# Patient Record
Sex: Female | Born: 1955
Health system: Southern US, Community
[De-identification: ages and names within clinical notes are randomized; demographics above are authoritative.]

## PROBLEM LIST (undated history)

## (undated) DIAGNOSIS — R911 Solitary pulmonary nodule: Secondary | ICD-10-CM

## (undated) DIAGNOSIS — M1990 Unspecified osteoarthritis, unspecified site: Secondary | ICD-10-CM

## (undated) DIAGNOSIS — K219 Gastro-esophageal reflux disease without esophagitis: Secondary | ICD-10-CM

## (undated) HISTORY — PX: ABDOMINAL HYSTERECTOMY: SHX81

---

## 2005-10-26 ENCOUNTER — Ambulatory Visit: Payer: Self-pay

## 2007-07-23 ENCOUNTER — Ambulatory Visit: Payer: Self-pay | Admitting: Internal Medicine

## 2009-04-07 ENCOUNTER — Ambulatory Visit: Payer: Self-pay | Admitting: Internal Medicine

## 2010-01-03 ENCOUNTER — Ambulatory Visit: Payer: Self-pay | Admitting: Internal Medicine

## 2010-01-25 ENCOUNTER — Ambulatory Visit: Payer: Self-pay | Admitting: Internal Medicine

## 2011-02-01 ENCOUNTER — Ambulatory Visit: Payer: Self-pay

## 2012-02-27 ENCOUNTER — Ambulatory Visit: Payer: Self-pay | Admitting: Internal Medicine

## 2012-07-29 ENCOUNTER — Ambulatory Visit: Payer: Self-pay | Admitting: Internal Medicine

## 2012-07-30 ENCOUNTER — Ambulatory Visit: Payer: Self-pay | Admitting: Internal Medicine

## 2013-04-22 ENCOUNTER — Ambulatory Visit: Payer: Self-pay | Admitting: Internal Medicine

## 2014-08-13 ENCOUNTER — Ambulatory Visit: Payer: Self-pay | Admitting: Physician Assistant

## 2014-08-28 ENCOUNTER — Ambulatory Visit: Payer: Self-pay | Admitting: Physician Assistant

## 2015-08-03 ENCOUNTER — Other Ambulatory Visit: Payer: Self-pay | Admitting: Physician Assistant

## 2015-08-03 DIAGNOSIS — Z1231 Encounter for screening mammogram for malignant neoplasm of breast: Secondary | ICD-10-CM

## 2015-08-17 ENCOUNTER — Ambulatory Visit
Admission: RE | Admit: 2015-08-17 | Discharge: 2015-08-17 | Disposition: A | Discharge status: Home / Self Care | Payer: BLUE CROSS/BLUE SHIELD | Source: Ambulatory Visit | Attending: Physician Assistant | Admitting: Physician Assistant

## 2015-08-17 DIAGNOSIS — Z1231 Encounter for screening mammogram for malignant neoplasm of breast: Secondary | ICD-10-CM | POA: Insufficient documentation

## 2015-08-18 ENCOUNTER — Ambulatory Visit: Payer: BLUE CROSS/BLUE SHIELD

## 2015-08-18 ENCOUNTER — Ambulatory Visit: Payer: Self-pay

## 2016-02-14 DIAGNOSIS — L578 Other skin changes due to chronic exposure to nonionizing radiation: Secondary | ICD-10-CM | POA: Diagnosis not present

## 2016-02-14 DIAGNOSIS — L570 Actinic keratosis: Secondary | ICD-10-CM | POA: Diagnosis not present

## 2016-03-29 DIAGNOSIS — Z1321 Encounter for screening for nutritional disorder: Secondary | ICD-10-CM | POA: Diagnosis not present

## 2016-03-29 DIAGNOSIS — E6609 Other obesity due to excess calories: Secondary | ICD-10-CM | POA: Diagnosis not present

## 2016-03-29 DIAGNOSIS — Z6833 Body mass index (BMI) 33.0-33.9, adult: Secondary | ICD-10-CM | POA: Diagnosis not present

## 2016-03-29 DIAGNOSIS — Z1329 Encounter for screening for other suspected endocrine disorder: Secondary | ICD-10-CM | POA: Diagnosis not present

## 2016-03-29 DIAGNOSIS — R5383 Other fatigue: Secondary | ICD-10-CM | POA: Diagnosis not present

## 2016-05-11 ENCOUNTER — Ambulatory Visit: Payer: BLUE CROSS/BLUE SHIELD | Admitting: Internal Medicine

## 2016-05-24 DIAGNOSIS — Z13220 Encounter for screening for lipoid disorders: Secondary | ICD-10-CM | POA: Diagnosis not present

## 2016-05-24 DIAGNOSIS — Z01419 Encounter for gynecological examination (general) (routine) without abnormal findings: Secondary | ICD-10-CM | POA: Diagnosis not present

## 2016-05-24 DIAGNOSIS — N952 Postmenopausal atrophic vaginitis: Secondary | ICD-10-CM | POA: Diagnosis not present

## 2016-05-24 DIAGNOSIS — Z6831 Body mass index (BMI) 31.0-31.9, adult: Secondary | ICD-10-CM | POA: Diagnosis not present

## 2016-06-12 DIAGNOSIS — Z1211 Encounter for screening for malignant neoplasm of colon: Secondary | ICD-10-CM | POA: Diagnosis not present

## 2016-06-12 DIAGNOSIS — Z01818 Encounter for other preprocedural examination: Secondary | ICD-10-CM | POA: Diagnosis not present

## 2016-07-31 DIAGNOSIS — Z1211 Encounter for screening for malignant neoplasm of colon: Secondary | ICD-10-CM | POA: Diagnosis not present

## 2016-07-31 DIAGNOSIS — K648 Other hemorrhoids: Secondary | ICD-10-CM | POA: Diagnosis not present

## 2016-07-31 DIAGNOSIS — D126 Benign neoplasm of colon, unspecified: Secondary | ICD-10-CM | POA: Diagnosis not present

## 2016-07-31 DIAGNOSIS — K5730 Diverticulosis of large intestine without perforation or abscess without bleeding: Secondary | ICD-10-CM | POA: Diagnosis not present

## 2016-07-31 DIAGNOSIS — D122 Benign neoplasm of ascending colon: Secondary | ICD-10-CM | POA: Diagnosis not present

## 2016-07-31 DIAGNOSIS — K635 Polyp of colon: Secondary | ICD-10-CM | POA: Diagnosis not present

## 2016-08-03 DIAGNOSIS — Z1211 Encounter for screening for malignant neoplasm of colon: Secondary | ICD-10-CM | POA: Diagnosis not present

## 2016-08-03 DIAGNOSIS — D126 Benign neoplasm of colon, unspecified: Secondary | ICD-10-CM | POA: Diagnosis not present

## 2016-08-30 DIAGNOSIS — L570 Actinic keratosis: Secondary | ICD-10-CM | POA: Diagnosis not present

## 2016-08-30 DIAGNOSIS — L821 Other seborrheic keratosis: Secondary | ICD-10-CM | POA: Diagnosis not present

## 2016-08-30 DIAGNOSIS — L812 Freckles: Secondary | ICD-10-CM | POA: Diagnosis not present

## 2016-08-30 DIAGNOSIS — L578 Other skin changes due to chronic exposure to nonionizing radiation: Secondary | ICD-10-CM | POA: Diagnosis not present

## 2016-10-13 ENCOUNTER — Emergency Department: Payer: BLUE CROSS/BLUE SHIELD

## 2016-10-13 ENCOUNTER — Emergency Department
Admission: EM | Admit: 2016-10-13 | Discharge: 2016-10-13 | Disposition: A | Discharge status: Home / Self Care | Payer: BLUE CROSS/BLUE SHIELD | Attending: Emergency Medicine | Admitting: Emergency Medicine

## 2016-10-13 ENCOUNTER — Encounter: Payer: Self-pay | Admitting: Emergency Medicine

## 2016-10-13 DIAGNOSIS — K8051 Calculus of bile duct without cholangitis or cholecystitis with obstruction: Secondary | ICD-10-CM | POA: Insufficient documentation

## 2016-10-13 DIAGNOSIS — R1011 Right upper quadrant pain: Secondary | ICD-10-CM | POA: Diagnosis not present

## 2016-10-13 DIAGNOSIS — K8050 Calculus of bile duct without cholangitis or cholecystitis without obstruction: Secondary | ICD-10-CM

## 2016-10-13 LAB — URINALYSIS, COMPLETE (UACMP) WITH MICROSCOPIC
Bacteria, UA: NONE SEEN
Bilirubin Urine: NEGATIVE
Glucose, UA: NEGATIVE mg/dL
Hgb urine dipstick: NEGATIVE
KETONES UR: NEGATIVE mg/dL
Leukocytes, UA: NEGATIVE
Nitrite: NEGATIVE
PH: 6 (ref 5.0–8.0)
Protein, ur: NEGATIVE mg/dL
SPECIFIC GRAVITY, URINE: 1.009 (ref 1.005–1.030)

## 2016-10-13 LAB — COMPREHENSIVE METABOLIC PANEL
ALT: 20 U/L (ref 14–54)
AST: 20 U/L (ref 15–41)
Albumin: 4.4 g/dL (ref 3.5–5.0)
Alkaline Phosphatase: 69 U/L (ref 38–126)
Anion gap: 6 (ref 5–15)
BUN: 11 mg/dL (ref 6–20)
CHLORIDE: 106 mmol/L (ref 101–111)
CO2: 24 mmol/L (ref 22–32)
CREATININE: 0.78 mg/dL (ref 0.44–1.00)
Calcium: 9.3 mg/dL (ref 8.9–10.3)
GFR calc Af Amer: >60 mL/min (ref >60)
GFR calc non Af Amer: >60 mL/min (ref >60)
GLUCOSE: 100 mg/dL — AB (ref 65–99)
Potassium: 4 mmol/L (ref 3.5–5.1)
SODIUM: 136 mmol/L (ref 135–145)
Total Bilirubin: 1.1 mg/dL (ref 0.3–1.2)
Total Protein: 7.4 g/dL (ref 6.5–8.1)

## 2016-10-13 LAB — LIPASE, BLOOD: LIPASE: 33 U/L (ref 11–51)

## 2016-10-13 LAB — CBC
HCT: 41.9 % (ref 35.0–47.0)
Hemoglobin: 14.5 g/dL (ref 12.0–16.0)
MCH: 31.2 pg (ref 26.0–34.0)
MCHC: 34.7 g/dL (ref 32.0–36.0)
MCV: 90.1 fL (ref 80.0–100.0)
PLATELETS: 227 10*3/uL (ref 150–440)
RBC: 4.66 MIL/uL (ref 3.80–5.20)
RDW: 12.5 % (ref 11.5–14.5)
WBC: 6.7 10*3/uL (ref 3.6–11.0)

## 2016-10-13 MED ORDER — OXYCODONE-ACETAMINOPHEN 5-325 MG PO TABS: 1.0000 | ORAL_TABLET | Freq: Four times a day (QID) | ORAL | 0 refills | Status: DC | PRN

## 2016-10-13 MED ORDER — IOPAMIDOL (ISOVUE-300) INJECTION 61%
30.0000 mL | Freq: Once | INTRAVENOUS | Status: AC | PRN
  Administered 2016-10-13: 30 mL via ORAL

## 2016-10-13 MED ORDER — IOPAMIDOL (ISOVUE-300) INJECTION 61%
100.0000 mL | Freq: Once | INTRAVENOUS | Status: AC | PRN
  Administered 2016-10-13: 100 mL via INTRAVENOUS

## 2016-10-13 NOTE — ED Notes (Signed)
Pt called out stating she was done with contrast, CT notified.  

## 2016-10-13 NOTE — ED Triage Notes (Signed)
RUQ pain for 2 days. Had had some nausea/vomiting intermittently over past 2 months. Denies known fevers or diarrhea. Reports black stool over past 2 days.

## 2016-10-13 NOTE — ED Notes (Signed)
Patient c/o RUQ abd pain and epigastric abd pain. Patient states that this is an on going issue but has been worse the past 2 days. Patient report intermittent nausea and vomiting for the past 2 months. Patient states that she does have hx/o gallstones.  Patient denies bright red blood in her stool but states that for the past 2 days her stool has been black. Patient has been taking Pepto-bismol.   Patient in NAD at this time. Breathing is equal and unlabored.

## 2016-10-13 NOTE — ED Provider Notes (Signed)
[facility] Emergency Department Provider Note   ____________________________________________   First MD Initiated Contact with Patient 10/13/16 1343     (approximate)  I have reviewed the triage vital signs and the nursing notes.   HISTORY  Chief Complaint Abdominal Pain    HPI Erin Weiss is a 60 y.o. female who reports a past history of gallstones. He also reports about 2-1/2 months of intermittent vomiting. None for the last 2 days she's had episodes of right upper quadrant or epigastric pain. This is especially worse at night for the last 2 nights. It was unresponsive to Pepcid Zantac and Pepto-Bismol. Pain was severe. It was not associated with fatty foods or meals at all.   History reviewed. No pertinent past medical history.  There are no active problems to display for this patient.   Past Surgical History:  Procedure Laterality Date  . ABDOMINAL HYSTERECTOMY    . CESAREAN SECTION      Prior to Admission medications   Medication Sig Start Date End Date Taking? Authorizing Provider  oxyCODONE-acetaminophen (ROXICET) 5-325 MG tablet Take 1 tablet by mouth every 6 (six) hours as needed. 10/13/16 10/13/17  Arnaldo Natal, MD    Allergies Penicillins  History reviewed. No pertinent family history.  Social History Social History  Substance Use Topics  . Smoking status: Never Smoker  . Smokeless tobacco: Never Used  . Alcohol use Yes    Review of Systems Constitutional: No fever/chills Eyes: No visual changes. ENT: No sore throat. Cardiovascular: Denies chest pain. Respiratory: Denies shortness of breath. Gastrointestinal: See history of present illness Genitourinary: Negative for dysuria. Musculoskeletal: Negative for back pain. Skin: Negative for rash. Neurological: Negative for headaches, focal weakness or numbness.  10-point ROS otherwise negative.  ____________________________________________   PHYSICAL  EXAM:  VITAL SIGNS: ED Triage Vitals [10/13/16 1028]  Enc Vitals Group     BP 131/88     Pulse Rate 89     Resp 18     Temp 98.1 F (36.7 C)     Temp Source Oral     SpO2 99 %     Weight 180 lb (81.6 kg)     Height 5\' 5"  (1.651 m)     Head Circumference      Peak Flow      Pain Score 4     Pain Loc      Pain Edu?      Excl. in GC?     Constitutional: Alert and oriented. Well appearing and in no acute distress. Eyes: Conjunctivae are normal. PERRL. EOMI. Head: Atraumatic. Nose: No congestion/rhinnorhea. Mouth/Throat: Mucous membranes are moist.  Oropharynx non-erythematous. Neck: No stridor. Cardiovascular: Normal rate, regular rhythm. Grossly normal heart sounds.  Good peripheral circulation. Respiratory: Normal respiratory effort.  No retractions. Lungs CTAB. Gastrointestinal: Soft and nontenderExcept for to deep palpation in the epigastric area and right upper quadrant. No distention. No abdominal bruits. No CVA tenderness. No tenderness to percussion Musculoskeletal: No lower extremity tenderness nor edema.  No joint effusions. Neurologic:  Normal speech and language. No gross focal neurologic deficits are appreciated. No gait instability. Skin:  Skin is warm, dry and intact. No rash noted.  ____________________________________________   LABS (all labs ordered are listed, but only abnormal results are displayed)  Labs Reviewed  COMPREHENSIVE METABOLIC PANEL - Abnormal; Notable for the following:       Result Value   Glucose, Bld 100 (*)    All other components within normal  limits  URINALYSIS, COMPLETE (UACMP) WITH MICROSCOPIC - Abnormal; Notable for the following:    Color, Urine YELLOW (*)    APPearance CLEAR (*)    Squamous Epithelial / LPF 0-5 (*)    All other components within normal limits  LIPASE, BLOOD  CBC   ____________________________________________  EKG  EKG read and interpreted by me shows normal sinus rhythm rate of 73 normal axis no acute  ST-T wave changes are or 2 PVCs present ____________________________________________  RADIOLOGY Addendum   ADDENDUM REPORT: 10/13/2016 15:58  ADDENDUM: 5 mm subpleural right lower lobe pulmonary nodule. No follow-up needed if patient is low-risk. Non-contrast chest CT can be considered in 12 months if patient is high-risk. This recommendation follows the consensus statement: Guidelines for Management of Incidental Pulmonary Nodules Detected on CT Images: From the Fleischner Society 2017; Radiology 2017; 284:228-243.   Electronically Signed   By: Jasmine PangKim  Fujinaga M.D.   On: 10/13/2016 15:58   Addended by Adrian ProwsKim M Fujinaga, MD on 10/13/2016 4:01 PM    Study Result   CLINICAL DATA:  Right upper quadrant abdominal pain and epigastric pain  EXAM: CT ABDOMEN AND PELVIS WITH CONTRAST  TECHNIQUE: Multidetector CT imaging of the abdomen and pelvis was performed using the standard protocol following bolus administration of intravenous contrast.  CONTRAST:  100mL ISOVUE-300 IOPAMIDOL (ISOVUE-300) INJECTION 61%  COMPARISON:  Ultrasound 07/30/2012  FINDINGS: Lower chest: No acute consolidation or pleural effusion. In the subpleural right lower lobe there is a 5 x 4 mm nodule (5 mm mean diameter) on image 13 of series 4. No other lung nodules are visualized. Heart size nonenlarged.  Hepatobiliary: There is fatty infiltration of the liver. No focal hepatic abnormality. Multiple large calcified gallstones. No wall thickening. No biliary dilatation.  Pancreas: Unremarkable. No pancreatic ductal dilatation or surrounding inflammatory changes.  Spleen: Normal in size without focal abnormality.  Adrenals/Urinary Tract: Adrenal glands are unremarkable. Kidneys are normal, without renal calculi, focal lesion, or hydronephrosis. Bladder is unremarkable.  Stomach/Bowel: Stomach is within normal limits. Appendix appears normal. No evidence of bowel wall thickening,  distention, or inflammatory changes. Diverticular disease of the sigmoid colon without wall thickening or acute inflammation.  Vascular/Lymphatic: Aortic atherosclerosis. No enlarged abdominal or pelvic lymph nodes.  Reproductive: Status post hysterectomy. No adnexal masses.  Other: No abdominal wall hernia or abnormality. No abdominopelvic ascites.  Musculoskeletal: No acute or significant osseous findings.  IMPRESSION: 1. No CT evidence for acute intra- abdominal or pelvic pathology 2. Large calcified gallstones without wall thickening or biliary dilatation 3. Fatty infiltration of the liver 4. Sigmoid colon diverticular disease without acute inflammation  Electronically Signed: By: Jasmine PangKim  Fujinaga M.D. On: 10/13/2016 15:49        ____________________________________________   PROCEDURES  Procedure(s) performed:   Procedures  Critical Care performed:   ____________________________________________   INITIAL IMPRESSION / ASSESSMENT AND PLAN / ED COURSE  Pertinent labs & imaging results that were available during my care of the patient were reviewed by me and considered in my medical decision making (see chart for details).    Clinical Course   Discussed patient with surgery. They feel since her labs are normal her CT shows no inflammation and she is not currently having any large amount of pain that she can follow-up and as an outpatient in the office. I will relay this information to her.  ____________________________________________   FINAL CLINICAL IMPRESSION(S) / ED DIAGNOSES  Final diagnoses:  Biliary colic      NEW MEDICATIONS  STARTED DURING THIS VISIT:  New Prescriptions   OXYCODONE-ACETAMINOPHEN (ROXICET) 5-325 MG TABLET    Take 1 tablet by mouth every 6 (six) hours as needed.     Note:  This document was prepared using Dragon voice recognition software and may include unintentional dictation errors.    Arnaldo NatalPaul F Analya Louissaint,  MD 10/13/16 [phone]

## 2016-10-13 NOTE — Discharge Instructions (Signed)
Please eat a low-fat diet. [facility] Monday morning they should be on to see you in the office and schedule a cholecystectomy , in other words take your gallbladder out. Please return for worse pain fever or vomiting. You can use the Percocet 1 or 2 pills 4 times a day if needed for the pain. Be careful and can make you woozy do not fall, do not drive on it. Also Percocet can make you constipated.

## 2016-10-18 ENCOUNTER — Other Ambulatory Visit: Payer: Self-pay

## 2016-10-26 ENCOUNTER — Encounter: Payer: Self-pay | Admitting: Surgery

## 2016-10-26 ENCOUNTER — Telehealth: Payer: Self-pay | Admitting: Surgery

## 2016-10-26 ENCOUNTER — Ambulatory Visit (INDEPENDENT_AMBULATORY_CARE_PROVIDER_SITE_OTHER): Payer: BLUE CROSS/BLUE SHIELD | Admitting: Surgery

## 2016-10-26 VITALS — BP 125/79 | HR 80 | Temp 98.4°F | Ht 65.0 in | Wt 182.8 lb

## 2016-10-26 DIAGNOSIS — K8050 Calculus of bile duct without cholangitis or cholecystitis without obstruction: Secondary | ICD-10-CM

## 2016-10-26 NOTE — Telephone Encounter (Signed)
Pt advised of pre op date/time and sx date. Sx: 11/03/16 with Dr Ludwig Clarksooper--Laparoscopic cholecystectomy.  Pre op: 11/01/16 between 1-5:00phone.   Patient made aware to call [phone], between 1-3:00pm the day before surgery, to find out what time to arrive.

## 2016-10-26 NOTE — Progress Notes (Signed)
  Surgical Consultation  10/26/2016  Erin Weiss is an 60 y.o. female.   ZO:XWRUEAVCC:biliary colic  HPI: This a patient who is in the emergency room with biliary colic. Patient has had recurrent and episodic right upper quadrant pain associated fatty food intolerance and workup showing gallstones. She denies jaundice or acholic stools. Has had no fevers or chills.  No past medical history on file.  Past Surgical History:  Procedure Laterality Date  . ABDOMINAL HYSTERECTOMY    . CESAREAN SECTION      No family history on file. No family history of gallbladder disease  Social History:  reports that she has never smoked. She has never used smokeless tobacco. She reports that she drinks alcohol. She reports that she does not use drugs.  Allergies:  Allergies  Allergen Reactions  . Penicillins Swelling    Medications reviewed.   Review of Systems:   Review of Systems  Constitutional: Negative.   HENT: Negative.   Eyes: Negative.   Respiratory: Negative.   Cardiovascular: Negative.   Gastrointestinal: Positive for abdominal pain and nausea. Negative for blood in stool, constipation, diarrhea, heartburn, melena and vomiting.  Genitourinary: Negative.   Musculoskeletal: Negative.   Skin: Negative.   Neurological: Negative.   Endo/Heme/Allergies: Negative.   Psychiatric/Behavioral: Negative.      Physical Exam:  There were no vitals taken for this visit.  Physical Exam  Constitutional: She is oriented to person, place, and time and well-developed, well-nourished, and in no distress. No distress.  HENT:  Head: Normocephalic and atraumatic.  Eyes: Pupils are equal, round, and reactive to light. Right eye exhibits no discharge. Left eye exhibits no discharge. No scleral icterus.  Neck: Normal range of motion.  Cardiovascular: Normal rate, regular rhythm and normal heart sounds.   Pulmonary/Chest: Effort normal. No respiratory distress. She has no wheezes. She has no rales.   Abdominal: Soft. She exhibits no distension. There is no tenderness. There is no rebound and no guarding.  Musculoskeletal: Normal range of motion. She exhibits no edema or tenderness.  Lymphadenopathy:    She has no cervical adenopathy.  Neurological: She is alert and oriented to person, place, and time.  Skin: Skin is warm and dry. She is not diaphoretic. No erythema.  Psychiatric: Mood and affect normal.  Vitals reviewed.     No results found for this or any previous visit (from the past 48 hour(s)). No results found.  Assessment/Plan:  Biliary colic. She requires laparoscopic cholecystectomy for control of her frequent symptoms. The rationale for this been discussed the options of observation reviewed and the risk of bleeding infection recurrence of symptoms failure to resolve her symptoms conversion to an open procedure bile duct damage bile duct leak retained common bile duct stone any of which could require further surgery and/or ERCP stent papillotomy were all reviewed with her she understood and agreed to proceed.  Lattie Hawichard E Cooper, MD, FACS

## 2016-10-26 NOTE — Patient Instructions (Signed)
We have scheduled your surgery on 11-03-16 at [facility] with Dr.Cooper. Please see your blue pre-care sheet for surgery information. Please call our office if you have any questions or concerns.

## 2016-11-01 ENCOUNTER — Encounter
Admission: RE | Admit: 2016-11-01 | Discharge: 2016-11-01 | Disposition: A | Discharge status: Home / Self Care | Payer: BLUE CROSS/BLUE SHIELD | Source: Ambulatory Visit | Attending: Surgery | Admitting: Surgery

## 2016-11-01 HISTORY — DX: Unspecified osteoarthritis, unspecified site: M19.90

## 2016-11-01 HISTORY — DX: Gastro-esophageal reflux disease without esophagitis: K21.9

## 2016-11-01 HISTORY — DX: Solitary pulmonary nodule: R91.1

## 2016-11-01 NOTE — Patient Instructions (Addendum)
  Your procedure is scheduled on: 11-03-16 (FRIDAY) Report to Same Day Surgery 2nd floor medical mall [facility](Medical Mall Entrance-take elevator on left to 2nd floor.  Check in with surgery information desk.) To find out your arrival time please call [phone] between 1PM - 3PM on 11-02-16 (THURSDAY)  Remember: Instructions that are not followed completely may result in serious medical risk, up to and including death, or upon the discretion of your surgeon and anesthesiologist your surgery may need to be rescheduled.    _x___ 1. Do not eat food or drink liquids after midnight. No gum chewing or hard candies.     __x__ 2. No Alcohol for 24 hours before or after surgery.   __x__3. No Smoking for 24 prior to surgery.   ____  4. Bring all medications with you on the day of surgery if instructed.    __x__ 5. Notify your doctor if there is any change in your medical condition     (cold, fever, infections).     Do not wear jewelry, make-up, hairpins, clips or nail polish.  Do not wear lotions, powders, or perfumes. You may wear deodorant.  Do not shave 48 hours prior to surgery. Men may shave face and neck.  Do not bring valuables to the hospital.    [facility] any belongings or valuables.               Contacts, dentures or bridgework may not be worn into surgery.  Leave your suitcase in the car. After surgery it may be brought to your room.  For patients admitted to the hospital, discharge time is determined by your treatment team.   Patients discharged the day of surgery will not be allowed to drive home.  You will need someone to drive you home and stay with you the night of your procedure.    Please read over the following fact sheets that you were given:   [facility] Preparing [facility] and or MRSA Information   _X___ Take these medicines the morning of surgery with A SIP OF WATER:    1. PRILOSEC  2. ALSO TAKE A PRILOSEC BEFORE BED THE NIGHT BEFORE SURGERY  (THURSDAY NIGHT)  3.  4.  5.  6.  ____Fleets enema or Magnesium Citrate as directed.   _x___ Use CHG Soap or sage wipes as directed on instruction sheet   ____ Use inhalers on the day of surgery and bring to hospital day of surgery  ____ Stop metformin 2 days prior to surgery    ____ Take 1/2 of usual insulin dose the night before surgery and none on the morning of  surgery.   ____ Stop Aspirin, Coumadin, Pllavix ,Eliquis, Effient, or Pradaxa  x__ Stop Anti-inflammatories such as Advil, Aleve, Ibuprofen, Motrin, Naproxen,          Naprosyn, Goodies powders or aspirin products. Ok to take Tylenol OR OXYCODONE IF NEEDED   ____ Stop supplements until after surgery.    ____ Bring C-Pap to the hospital.

## 2016-11-02 ENCOUNTER — Encounter
Admission: RE | Admit: 2016-11-02 | Discharge: 2016-11-02 | Disposition: A | Discharge status: Home / Self Care | Payer: BLUE CROSS/BLUE SHIELD | Source: Ambulatory Visit | Attending: Surgery | Admitting: Surgery

## 2016-11-02 DIAGNOSIS — K8010 Calculus of gallbladder with chronic cholecystitis without obstruction: Secondary | ICD-10-CM | POA: Diagnosis not present

## 2016-11-02 DIAGNOSIS — Z880 Allergy status to penicillin: Secondary | ICD-10-CM | POA: Diagnosis not present

## 2016-11-02 DIAGNOSIS — E669 Obesity, unspecified: Secondary | ICD-10-CM | POA: Diagnosis not present

## 2016-11-02 DIAGNOSIS — K8064 Calculus of gallbladder and bile duct with chronic cholecystitis without obstruction: Secondary | ICD-10-CM | POA: Diagnosis present

## 2016-11-02 DIAGNOSIS — Z90710 Acquired absence of both cervix and uterus: Secondary | ICD-10-CM | POA: Diagnosis not present

## 2016-11-02 LAB — CBC WITH DIFFERENTIAL/PLATELET
BASOS PCT: 1 %
Basophils Absolute: 0.1 10*3/uL (ref 0–0.1)
EOS ABS: 0.1 10*3/uL (ref 0–0.7)
EOS PCT: 2 %
HCT: 40.2 % (ref 35.0–47.0)
HEMOGLOBIN: 13.9 g/dL (ref 12.0–16.0)
LYMPHS ABS: 1.8 10*3/uL (ref 1.0–3.6)
Lymphocytes Relative: 34 %
MCH: 30.9 pg (ref 26.0–34.0)
MCHC: 34.6 g/dL (ref 32.0–36.0)
MCV: 89.3 fL (ref 80.0–100.0)
MONO ABS: 0.3 10*3/uL (ref 0.2–0.9)
MONOS PCT: 7 %
NEUTROS PCT: 56 %
Neutro Abs: 2.9 10*3/uL (ref 1.4–6.5)
Platelets: 214 10*3/uL (ref 150–440)
RBC: 4.5 MIL/uL (ref 3.80–5.20)
RDW: 12.6 % (ref 11.5–14.5)
WBC: 5.2 10*3/uL (ref 3.6–11.0)

## 2016-11-02 LAB — COMPREHENSIVE METABOLIC PANEL
ALBUMIN: 4.3 g/dL (ref 3.5–5.0)
ALK PHOS: 67 U/L (ref 38–126)
ALT: 23 U/L (ref 14–54)
ANION GAP: 5 (ref 5–15)
AST: 25 U/L (ref 15–41)
BUN: 14 mg/dL (ref 6–20)
CO2: 28 mmol/L (ref 22–32)
Calcium: 9.7 mg/dL (ref 8.9–10.3)
Chloride: 108 mmol/L (ref 101–111)
Creatinine, Ser: 0.77 mg/dL (ref 0.44–1.00)
GFR calc non Af Amer: >60 mL/min (ref >60)
Glucose, Bld: 98 mg/dL (ref 65–99)
POTASSIUM: 4.2 mmol/L (ref 3.5–5.1)
SODIUM: 141 mmol/L (ref 135–145)
TOTAL PROTEIN: 7.4 g/dL (ref 6.5–8.1)
Total Bilirubin: 0.9 mg/dL (ref 0.3–1.2)

## 2016-11-03 ENCOUNTER — Encounter
Admission: RE | Disposition: A | Discharge status: Home / Self Care | Payer: Self-pay | Source: Ambulatory Visit | Attending: Surgery

## 2016-11-03 ENCOUNTER — Ambulatory Visit
Admission: RE | Admit: 2016-11-03 | Discharge: 2016-11-03 | Disposition: A | Discharge status: Home / Self Care | Payer: BLUE CROSS/BLUE SHIELD | Source: Ambulatory Visit | Attending: Surgery | Admitting: Surgery

## 2016-11-03 ENCOUNTER — Ambulatory Visit: Payer: BLUE CROSS/BLUE SHIELD | Admitting: Anesthesiology

## 2016-11-03 ENCOUNTER — Encounter: Payer: Self-pay | Admitting: *Deleted

## 2016-11-03 DIAGNOSIS — K8010 Calculus of gallbladder with chronic cholecystitis without obstruction: Secondary | ICD-10-CM | POA: Insufficient documentation

## 2016-11-03 DIAGNOSIS — Z880 Allergy status to penicillin: Secondary | ICD-10-CM | POA: Diagnosis not present

## 2016-11-03 DIAGNOSIS — Z90710 Acquired absence of both cervix and uterus: Secondary | ICD-10-CM | POA: Diagnosis not present

## 2016-11-03 DIAGNOSIS — E669 Obesity, unspecified: Secondary | ICD-10-CM | POA: Diagnosis not present

## 2016-11-03 DIAGNOSIS — K8050 Calculus of bile duct without cholangitis or cholecystitis without obstruction: Secondary | ICD-10-CM | POA: Diagnosis not present

## 2016-11-03 DIAGNOSIS — K828 Other specified diseases of gallbladder: Secondary | ICD-10-CM | POA: Diagnosis not present

## 2016-11-03 HISTORY — PX: CHOLECYSTECTOMY: SHX55

## 2016-11-03 SURGERY — LAPAROSCOPIC CHOLECYSTECTOMY
Anesthesia: General | Wound class: Clean Contaminated

## 2016-11-03 MED ORDER — ROCURONIUM BROMIDE 100 MG/10ML IV SOLN
INTRAVENOUS | Status: DC | PRN
  Administered 2016-11-03: 30 mg via INTRAVENOUS

## 2016-11-03 MED ORDER — MIDAZOLAM HCL 2 MG/2ML IJ SOLN
INTRAMUSCULAR | Status: AC
  Filled 2016-11-03: qty 2

## 2016-11-03 MED ORDER — DEXAMETHASONE SODIUM PHOSPHATE 10 MG/ML IJ SOLN
INTRAMUSCULAR | Status: AC
  Filled 2016-11-03: qty 1

## 2016-11-03 MED ORDER — PROPOFOL 10 MG/ML IV BOLUS
INTRAVENOUS | Status: AC
  Filled 2016-11-03: qty 20

## 2016-11-03 MED ORDER — BUPIVACAINE-EPINEPHRINE (PF) 0.25% -1:200000 IJ SOLN
INTRAMUSCULAR | Status: AC
  Filled 2016-11-03: qty 30

## 2016-11-03 MED ORDER — FENTANYL CITRATE (PF) 100 MCG/2ML IJ SOLN
INTRAMUSCULAR | Status: AC
  Filled 2016-11-03: qty 2

## 2016-11-03 MED ORDER — CHLORHEXIDINE GLUCONATE CLOTH 2 % EX PADS: 6.0000 | MEDICATED_PAD | Freq: Once | CUTANEOUS | Status: DC

## 2016-11-03 MED ORDER — HEPARIN SODIUM (PORCINE) 5000 UNIT/ML IJ SOLN
INTRAMUSCULAR | Status: AC
  Administered 2016-11-03: 5000 [IU] via SUBCUTANEOUS
  Filled 2016-11-03: qty 1

## 2016-11-03 MED ORDER — SUGAMMADEX SODIUM 200 MG/2ML IV SOLN
INTRAVENOUS | Status: DC | PRN
  Administered 2016-11-03: 170 mg via INTRAVENOUS

## 2016-11-03 MED ORDER — ONDANSETRON HCL 4 MG/2ML IJ SOLN: 4.0000 mg | Freq: Once | INTRAMUSCULAR | Status: DC | PRN

## 2016-11-03 MED ORDER — BUPIVACAINE-EPINEPHRINE (PF) 0.25% -1:200000 IJ SOLN
INTRAMUSCULAR | Status: DC | PRN
  Administered 2016-11-03: 30 mL

## 2016-11-03 MED ORDER — LIDOCAINE HCL (CARDIAC) 20 MG/ML IV SOLN
INTRAVENOUS | Status: DC | PRN
  Administered 2016-11-03: 60 mg via INTRAVENOUS

## 2016-11-03 MED ORDER — LACTATED RINGERS IV SOLN
INTRAVENOUS | Status: DC
  Administered 2016-11-03 (×2): via INTRAVENOUS

## 2016-11-03 MED ORDER — CIPROFLOXACIN IN D5W 400 MG/200ML IV SOLN
INTRAVENOUS | Status: AC
  Administered 2016-11-03: 400 mg via INTRAVENOUS
  Filled 2016-11-03: qty 200

## 2016-11-03 MED ORDER — ONDANSETRON HCL 4 MG/2ML IJ SOLN
INTRAMUSCULAR | Status: DC | PRN
  Administered 2016-11-03: 4 mg via INTRAVENOUS

## 2016-11-03 MED ORDER — EPHEDRINE SULFATE 50 MG/ML IJ SOLN
INTRAMUSCULAR | Status: DC | PRN
  Administered 2016-11-03 (×2): 10 mg via INTRAVENOUS

## 2016-11-03 MED ORDER — PROPOFOL 10 MG/ML IV BOLUS
INTRAVENOUS | Status: DC | PRN
  Administered 2016-11-03: 170 mg via INTRAVENOUS

## 2016-11-03 MED ORDER — EPHEDRINE 5 MG/ML INJ
INTRAVENOUS | Status: AC
  Filled 2016-11-03: qty 10

## 2016-11-03 MED ORDER — ACETAMINOPHEN 500 MG PO TABS
ORAL_TABLET | ORAL | Status: AC
  Administered 2016-11-03: 1000 mg via ORAL
  Filled 2016-11-03: qty 2

## 2016-11-03 MED ORDER — FENTANYL CITRATE (PF) 100 MCG/2ML IJ SOLN: 25.0000 ug | INTRAMUSCULAR | Status: DC | PRN

## 2016-11-03 MED ORDER — FENTANYL CITRATE (PF) 100 MCG/2ML IJ SOLN
INTRAMUSCULAR | Status: DC | PRN
Start: 2016-11-03 — End: 2016-11-03
  Administered 2016-11-03 (×2): 100 ug via INTRAVENOUS

## 2016-11-03 MED ORDER — MIDAZOLAM HCL 2 MG/2ML IJ SOLN
INTRAMUSCULAR | Status: DC | PRN
  Administered 2016-11-03: 2 mg via INTRAVENOUS

## 2016-11-03 MED ORDER — CIPROFLOXACIN IN D5W 400 MG/200ML IV SOLN
400.0000 mg | INTRAVENOUS | Status: AC
  Administered 2016-11-03: 400 mg via INTRAVENOUS

## 2016-11-03 MED ORDER — DEXAMETHASONE SODIUM PHOSPHATE 10 MG/ML IJ SOLN
INTRAMUSCULAR | Status: DC | PRN
  Administered 2016-11-03: 5 mg via INTRAVENOUS

## 2016-11-03 MED ORDER — ONDANSETRON HCL 4 MG/2ML IJ SOLN
INTRAMUSCULAR | Status: AC
  Filled 2016-11-03: qty 2

## 2016-11-03 MED ORDER — LIDOCAINE 2% (20 MG/ML) 5 ML SYRINGE
INTRAMUSCULAR | Status: AC
  Filled 2016-11-03: qty 5

## 2016-11-03 MED ORDER — ROCURONIUM BROMIDE 50 MG/5ML IV SOSY
PREFILLED_SYRINGE | INTRAVENOUS | Status: AC
  Filled 2016-11-03: qty 5

## 2016-11-03 MED ORDER — HYDROCODONE-ACETAMINOPHEN 5-300 MG PO TABS: 1.0000 | ORAL_TABLET | ORAL | 0 refills | Status: DC | PRN

## 2016-11-03 MED ORDER — SUGAMMADEX SODIUM 200 MG/2ML IV SOLN
INTRAVENOUS | Status: AC
  Filled 2016-11-03: qty 2

## 2016-11-03 MED ORDER — ACETAMINOPHEN 500 MG PO TABS
1000.0000 mg | ORAL_TABLET | ORAL | Status: AC
  Administered 2016-11-03: 1000 mg via ORAL

## 2016-11-03 MED ORDER — HEPARIN SODIUM (PORCINE) 5000 UNIT/ML IJ SOLN
5000.0000 [IU] | Freq: Once | INTRAMUSCULAR | Status: AC
  Administered 2016-11-03: 5000 [IU] via SUBCUTANEOUS

## 2016-11-03 SURGICAL SUPPLY — 42 items
ADHESIVE MASTISOL STRL (MISCELLANEOUS) ×2 IMPLANT
APPLIER CLIP ROT 10 11.4 M/L (STAPLE) ×2
BLADE SURG SZ11 CARB STEEL (BLADE) ×2 IMPLANT
CANISTER SUCT 1200ML W/VALVE (MISCELLANEOUS) ×2 IMPLANT
CATH CHOLANGI 4FR 420404F (CATHETERS) IMPLANT
CHLORAPREP W/TINT 26ML (MISCELLANEOUS) ×2 IMPLANT
CLIP APPLIE ROT 10 11.4 M/L (STAPLE) ×1 IMPLANT
CONRAY 60ML FOR OR (MISCELLANEOUS) IMPLANT
DRAPE C-ARM XRAY 36X54 (DRAPES) IMPLANT
ELECT REM PT RETURN 9FT ADLT (ELECTROSURGICAL) ×2
ELECTRODE REM PT RTRN 9FT ADLT (ELECTROSURGICAL) ×1 IMPLANT
ENDOPOUCH RETRIEVER 10 (MISCELLANEOUS) ×2 IMPLANT
GAUZE SPONGE NON-WVN 2X2 STRL (MISCELLANEOUS) ×4 IMPLANT
GLOVE BIO SURGEON STRL SZ8 (GLOVE) ×10 IMPLANT
GOWN STRL REUS W/ TWL LRG LVL3 (GOWN DISPOSABLE) ×4 IMPLANT
GOWN STRL REUS W/TWL LRG LVL3 (GOWN DISPOSABLE) ×4
IRRIGATION STRYKERFLOW (MISCELLANEOUS) IMPLANT
IRRIGATOR STRYKERFLOW (MISCELLANEOUS)
IV CATH ANGIO 12GX3 LT BLUE (NEEDLE) IMPLANT
IV NS 1000ML (IV SOLUTION)
IV NS 1000ML BAXH (IV SOLUTION) IMPLANT
JACKSON PRATT 10 (INSTRUMENTS) IMPLANT
KIT RM TURNOVER STRD PROC AR (KITS) ×2 IMPLANT
LABEL OR SOLS (LABEL) ×2 IMPLANT
NDL SAFETY 22GX1.5 (NEEDLE) ×2 IMPLANT
NEEDLE VERESS 14GA 120MM (NEEDLE) ×2 IMPLANT
NS IRRIG 500ML POUR BTL (IV SOLUTION) ×2 IMPLANT
PACK LAP CHOLECYSTECTOMY (MISCELLANEOUS) ×2 IMPLANT
SCISSORS METZENBAUM CVD 33 (INSTRUMENTS) ×2 IMPLANT
SLEEVE ENDOPATH XCEL 5M (ENDOMECHANICALS) ×4 IMPLANT
SPONGE LAP 18X18 5 PK (GAUZE/BANDAGES/DRESSINGS) ×2 IMPLANT
SPONGE VERSALON 2X2 STRL (MISCELLANEOUS) ×4
SPONGE VERSALON 4X4 4PLY (MISCELLANEOUS) IMPLANT
STRIP CLOSURE SKIN 1/2X4 (GAUZE/BANDAGES/DRESSINGS) ×2 IMPLANT
SUT MNCRL 4-0 (SUTURE) ×2
SUT MNCRL 4-0 27XMFL (SUTURE) ×2
SUT VICRYL 0 AB UR-6 (SUTURE) ×2 IMPLANT
SUTURE MNCRL 4-0 27XMF (SUTURE) ×2 IMPLANT
SYR 20CC LL (SYRINGE) ×2 IMPLANT
TROCAR XCEL NON-BLD 11X100MML (ENDOMECHANICALS) ×2 IMPLANT
TROCAR XCEL NON-BLD 5MMX100MML (ENDOMECHANICALS) ×2 IMPLANT
TUBING INSUFFLATOR HI FLOW (MISCELLANEOUS) ×2 IMPLANT

## 2016-11-03 NOTE — Anesthesia Procedure Notes (Signed)
Procedure Name: Intubation Date/Time: 11/03/2016 1:32 PM Performed by: Silvana Newness Pre-anesthesia Checklist: Patient identified, Emergency Drugs available, Suction available, Patient being monitored and Timeout performed Patient Re-evaluated:Patient Re-evaluated prior to inductionOxygen Delivery Method: Circle system utilized Preoxygenation: Pre-oxygenation with 100% oxygen Intubation Type: IV induction Ventilation: Mask ventilation without difficulty Laryngoscope Size: Mac and 3 Grade View: Grade I Tube type: Oral Tube size: 7.0 mm Number of attempts: 1 Airway Equipment and Method: Stylet Placement Confirmation: ETT inserted through vocal cords under direct vision,  positive ETCO2 and breath sounds checked- equal and bilateral Secured at: 19 cm Tube secured with: Tape Dental Injury: Teeth and Oropharynx as per pre-operative assessment

## 2016-11-03 NOTE — Op Note (Signed)
Laparoscopic Cholecystectomy  Pre-operative Diagnosis: Biliary colic  Post-operative Diagnosis: Biliary colic  Procedure: Laparoscopic cholecystectomy  Surgeon: Adah Salvageichard E. Excell Seltzerooper, MD FACS  Anesthesia: Gen. with endotracheal tube  Assistant: PA student  Procedure Details  The patient was seen again in the Holding Room. The benefits, complications, treatment options, and expected outcomes were discussed with the patient. The risks of bleeding, infection, recurrence of symptoms, failure to resolve symptoms, bile duct damage, bile duct leak, retained common bile duct stone, bowel injury, any of which could require further surgery and/or ERCP, stent, or papillotomy were reviewed with the patient. The likelihood of improving the patient's symptoms with return to their baseline status is good.  The patient and/or family concurred with the proposed plan, giving informed consent.  The patient was taken to Operating Room, identified as Erin HarmsNikki E Beighley and the procedure verified as Laparoscopic Cholecystectomy.  A Time Out was held and the above information confirmed.  Prior to the induction of general anesthesia, antibiotic prophylaxis was administered. VTE prophylaxis was in place. General endotracheal anesthesia was then administered and tolerated well. After the induction, the abdomen was prepped with Chloraprep and draped in the sterile fashion. The patient was positioned in the supine position.  Local anesthetic  was injected into the skin near the umbilicus and an incision made. The Veress needle was placed. Pneumoperitoneum was then created with CO2 and tolerated well without any adverse changes in the patient's vital signs. A 5mm port was placed in the periumbilical position and the abdominal cavity was explored.  Two 5-mm ports were placed in the right upper quadrant and a 12 mm epigastric port was placed all under direct vision. All skin incisions  were infiltrated with a local anesthetic agent  before making the incision and placing the trocars.   The patient was positioned  in reverse Trendelenburg, tilted slightly to the patient's left.  The gallbladder was identified, the fundus grasped and retracted cephalad. Adhesions were lysed bluntly. The infundibulum was grasped and retracted laterally, exposing the peritoneum overlying the triangle of Calot. This was then divided and exposed in a blunt fashion. A critical view of the cystic duct and cystic artery was obtained.  The cystic duct was clearly identified and bluntly dissected.   The cystic artery was doubly clipped and divided. This allowed for good visualization of the cystic duct as it entered the infundibulum of the gallbladder here was doubly clipped and divided and the gallbladder sacrum the gallbladder fossa.  The gallbladder was taken from the gallbladder fossa in a retrograde fashion with the electrocautery. The gallbladder was removed and placed in an Endocatch bag. The liver bed was irrigated and inspected. Hemostasis was achieved with the electrocautery. Copious irrigation was utilized and was repeatedly aspirated until clear.  The gallbladder and Endocatch sac were then removed through the epigastric port site.   Inspection of the right upper quadrant was performed. No bleeding, bile duct injury or leak, or bowel injury was noted. Pneumoperitoneum was released.  The epigastric port site was closed with figure-of-eight 0 Vicryl sutures. 4-0 subcuticular Monocryl was used to close the skin. Steristrips and Mastisol and sterile dressings were  applied.  The patient was then extubated and brought to the recovery room in stable condition. Sponge, lap, and needle counts were correct at closure and at the conclusion of the case.   Findings: Chronic Cholecystitis   Estimated Blood Loss: Minimal         Drains: None  Specimens: Gallbladder           Complications: none               Kinslie Hove E. Burt Knack, MD, FACS

## 2016-11-03 NOTE — Progress Notes (Signed)
Preoperative Review   Patient is met in the preoperative holding area. The history is reviewed in the chart and with the patient. I personally reviewed the options and rationale as well as the risks of this procedure that have been previously discussed with the patient. All questions asked by the patient and/or family were answered to their satisfaction.  Patient agrees to proceed with this procedure at this time.  Linc Renne E [facility] M.D. FACS  

## 2016-11-03 NOTE — Discharge Instructions (Signed)
Remove dressing in 24 hours. °May shower in 24 hours. °Leave paper strips in place. °Resume all home medications. °Follow-up with Dr. Cooper in 10 days. ° °AMBULATORY SURGERY  °DISCHARGE INSTRUCTIONS ° ° °1) The drugs that you were given will stay in your system until tomorrow so for the next 24 hours you should not: ° °A) Drive an automobile °B) Make any legal decisions °C) Drink any alcoholic beverage ° ° °2) You may resume regular meals tomorrow.  Today it is better to start with liquids and gradually work up to solid foods. ° °You may eat anything you prefer, but it is better to start with liquids, then soup and crackers, and gradually work up to solid foods. ° ° °3) Please notify your doctor immediately if you have any unusual bleeding, trouble breathing, redness and pain at the surgery site, drainage, fever, or pain not relieved by medication. ° ° ° °4) Additional Instructions: ° ° ° ° ° ° ° °Please contact your physician with any problems or Same Day Surgery at [phone], Monday through Friday 6 am to 4 pm, or [facility] [place] Main number at [phone]. °

## 2016-11-03 NOTE — Anesthesia Preprocedure Evaluation (Signed)
Anesthesia Evaluation  Patient identified by MRN, date of birth, ID band Patient awake    Reviewed: Allergy & Precautions, NPO status , Patient's Chart, lab work & pertinent test results  Airway Mallampati: II       Dental  (+) Teeth Intact   Pulmonary neg pulmonary ROS,    breath sounds clear to auscultation       Cardiovascular Exercise Tolerance: Good  Rhythm:Regular Rate:Normal     Neuro/Psych negative neurological ROS     GI/Hepatic Neg liver ROS, GERD  Medicated,  Endo/Other  negative endocrine ROS  Renal/GU negative Renal ROS     Musculoskeletal   Abdominal (+) + obese,   Peds  Hematology negative hematology ROS (+)   Anesthesia Other Findings   Reproductive/Obstetrics                             Anesthesia Physical Anesthesia Plan  ASA: II  Anesthesia Plan: General   Post-op Pain Management:    Induction: Intravenous  Airway Management Planned: Oral ETT  Additional Equipment:   Intra-op Plan:   Post-operative Plan: Extubation in OR  Informed Consent: I have reviewed the patients History and Physical, chart, labs and discussed the procedure including the risks, benefits and alternatives for the proposed anesthesia with the patient or authorized representative who has indicated his/her understanding and acceptance.     Plan Discussed with: CRNA  Anesthesia Plan Comments:         Anesthesia Quick Evaluation

## 2016-11-03 NOTE — Transfer of Care (Signed)
Immediate Anesthesia Transfer of Care Note  Patient: Erin Weiss  Procedure(s) Performed: Procedure(s): LAPAROSCOPIC CHOLECYSTECTOMY (N/A)  Patient Location: PACU  Anesthesia Type:General  Level of Consciousness: awake and patient cooperative  Airway & Oxygen Therapy: Patient Spontanous Breathing and Patient connected to face mask oxygen  Post-op Assessment: Report given to RN, Post -op Vital signs reviewed and stable and Patient moving all extremities X 4  Post vital signs: Reviewed and stable  Last Vitals:  Vitals:   11/03/16 1232  BP: (!) 153/82  Pulse: 89  Resp: 18  Temp: 36.3 C    Last Pain:  Vitals:   11/03/16 1232  TempSrc: Oral  PainSc: 0-No pain         Complications: No apparent anesthesia complications

## 2016-11-06 ENCOUNTER — Encounter: Payer: Self-pay | Admitting: Surgery

## 2016-11-07 LAB — SURGICAL PATHOLOGY

## 2016-11-09 NOTE — Anesthesia Postprocedure Evaluation (Signed)
Anesthesia Post Note  Patient: Herold HarmsNikki E Bellotti  Procedure(s) Performed: Procedure(s) (LRB): LAPAROSCOPIC CHOLECYSTECTOMY (N/A)  Patient location during evaluation: PACU Anesthesia Type: General Level of consciousness: awake and alert Pain management: pain level controlled Vital Signs Assessment: post-procedure vital signs reviewed and stable Respiratory status: spontaneous breathing, nonlabored ventilation, respiratory function stable and patient connected to nasal cannula oxygen Cardiovascular status: blood pressure returned to baseline and stable Postop Assessment: no signs of nausea or vomiting Anesthetic complications: no     Last Vitals:  Vitals:   11/03/16 1525 11/03/16 1529  BP: 120/76 122/72  Pulse: 86   Resp: 14 16  Temp: 36.9 C     Last Pain:  Vitals:   11/06/16 0823  TempSrc:   PainSc: 0-No pain                 Lenard SimmerAndrew Vedika Dumlao

## 2016-11-16 ENCOUNTER — Ambulatory Visit: Payer: BLUE CROSS/BLUE SHIELD | Admitting: Surgery

## 2016-11-21 ENCOUNTER — Ambulatory Visit (INDEPENDENT_AMBULATORY_CARE_PROVIDER_SITE_OTHER): Payer: BLUE CROSS/BLUE SHIELD | Admitting: Surgery

## 2016-11-21 ENCOUNTER — Encounter: Payer: Self-pay | Admitting: Surgery

## 2016-11-21 VITALS — BP 130/86 | HR 71 | Temp 98.0°F | Ht 65.0 in | Wt 181.8 lb

## 2016-11-21 DIAGNOSIS — Z9049 Acquired absence of other specified parts of digestive tract: Secondary | ICD-10-CM

## 2016-11-21 DIAGNOSIS — Z8719 Personal history of other diseases of the digestive system: Secondary | ICD-10-CM

## 2016-11-21 NOTE — Progress Notes (Signed)
11/21/2016  HPI: Patient is status post laparoscopic cholecystectomy with Dr. Excell Seltzerooper on 1/5. She presents today for her follow-up appointment. She reports that she is doing very well with no significant pain. She is tolerating a diet, with no nausea or vomiting, having regular bowel movements, and a good appetite.   Vital signs: BP 130/86   Pulse 71   Temp 98 F (36.7 C) (Oral)   Ht 5\' 5"  (1.651 m)   Wt 82.5 kg (181 lb 12.8 oz)   BMI 30.25 kg/m    Physical Exam: Constitutional: No acute distress Abdomen:  Soft, nondistended, nontender to palpation. All incisions are clean dry and intact with no evidence of infection.  Assessment/Plan: 61 year old female status post laparoscopic cholecystectomy.  -Patient recovering very well with no complications postoperatively. -Patient still has a no heavy lifting restriction of more than 10-15 pounds until 2/2. -Reviewed patient's CT scan from 12/15 which showed a 5 mm subpleural nodule in the right lower lobe. The patient does not smoke and has never smoked, however she did grow up with her father smoking and does have risk factors for secondhand smoking. I did discuss with the patient that she should get in touch with her PCP for potential further imaging studies as needed. -Reviewed the patient's pathology with her. Of note she did have focal low grade epithelial dysplasia but the cystic duct margin is free of dysplasia and otherwise was negative for malignancy. Discussed with the patient and no further procedures were needed at this point. -She may follow-up with us on an as-needed basis.   Howie IllJose Luis Satsuki Zillmer, MD [facility]

## 2016-11-21 NOTE — Patient Instructions (Signed)
Please do not lift anything over 15 pounds until 12/01/16. Please call our office if you have questions or concerns.

## 2016-12-08 ENCOUNTER — Telehealth: Payer: Self-pay | Admitting: Nurse Practitioner

## 2016-12-08 NOTE — Telephone Encounter (Signed)
Pt called and wanted to know if she could establish care with Dr. Lorin PicketScott her mom is Jacklynn BueLinda Kennedy. Please advise, thank you!  Call pt @ [phone] (work) [phone] (cell)

## 2016-12-08 NOTE — Telephone Encounter (Signed)
Please advise 

## 2016-12-08 NOTE — Telephone Encounter (Signed)
Pt stated that she had emergency gallbladder surgery in January and after a CT scan someone had casually mentioned a spot on her lung.

## 2016-12-11 NOTE — Telephone Encounter (Signed)
Please notify not taking new pts at this time, but we have other physicians here to help with f/u, etc - she can establish care with them.

## 2016-12-11 NOTE — Telephone Encounter (Signed)
Pt was informed that Dr.Scott not accepting new patient she will schedule an appt to see Rennie PlowmanMargaret Arnett, FNP.

## 2016-12-12 NOTE — Telephone Encounter (Signed)
Noted  okay

## 2016-12-22 ENCOUNTER — Ambulatory Visit: Payer: BLUE CROSS/BLUE SHIELD | Admitting: Primary Care

## 2016-12-25 ENCOUNTER — Ambulatory Visit (INDEPENDENT_AMBULATORY_CARE_PROVIDER_SITE_OTHER)
Admission: RE | Admit: 2016-12-25 | Discharge: 2016-12-25 | Disposition: A | Discharge status: Home / Self Care | Payer: BLUE CROSS/BLUE SHIELD | Source: Ambulatory Visit | Attending: Primary Care | Admitting: Primary Care

## 2016-12-25 ENCOUNTER — Encounter: Payer: Self-pay | Admitting: Primary Care

## 2016-12-25 ENCOUNTER — Ambulatory Visit
Admission: RE | Admit: 2016-12-25 | Discharge: 2016-12-25 | Disposition: A | Discharge status: Home / Self Care | Payer: BLUE CROSS/BLUE SHIELD | Source: Ambulatory Visit | Attending: Primary Care | Admitting: Primary Care

## 2016-12-25 ENCOUNTER — Ambulatory Visit (INDEPENDENT_AMBULATORY_CARE_PROVIDER_SITE_OTHER): Payer: BLUE CROSS/BLUE SHIELD | Admitting: Primary Care

## 2016-12-25 VITALS — BP 110/76 | HR 72 | Temp 98.1°F | Ht 64.0 in | Wt 184.1 lb

## 2016-12-25 DIAGNOSIS — M25641 Stiffness of right hand, not elsewhere classified: Secondary | ICD-10-CM | POA: Diagnosis not present

## 2016-12-25 DIAGNOSIS — E785 Hyperlipidemia, unspecified: Secondary | ICD-10-CM | POA: Diagnosis not present

## 2016-12-25 DIAGNOSIS — M2560 Stiffness of unspecified joint, not elsewhere classified: Secondary | ICD-10-CM | POA: Diagnosis not present

## 2016-12-25 DIAGNOSIS — M25642 Stiffness of left hand, not elsewhere classified: Secondary | ICD-10-CM | POA: Diagnosis not present

## 2016-12-25 DIAGNOSIS — R911 Solitary pulmonary nodule: Secondary | ICD-10-CM

## 2016-12-25 DIAGNOSIS — M2540 Effusion, unspecified joint: Secondary | ICD-10-CM

## 2016-12-25 DIAGNOSIS — M1990 Unspecified osteoarthritis, unspecified site: Secondary | ICD-10-CM

## 2016-12-25 LAB — COMPREHENSIVE METABOLIC PANEL
ALBUMIN: 4.3 g/dL (ref 3.5–5.2)
ALT: 22 U/L (ref 0–35)
AST: 26 U/L (ref 0–37)
Alkaline Phosphatase: 66 U/L (ref 39–117)
BUN: 11 mg/dL (ref 6–23)
CHLORIDE: 106 meq/L (ref 96–112)
CO2: 26 mEq/L (ref 19–32)
CREATININE: 0.78 mg/dL (ref 0.40–1.20)
Calcium: 9.6 mg/dL (ref 8.4–10.5)
GFR: 80.54 mL/min (ref >60.00)
GLUCOSE: 101 mg/dL — AB (ref 70–99)
POTASSIUM: 4.2 meq/L (ref 3.5–5.1)
SODIUM: 141 meq/L (ref 135–145)
Total Bilirubin: 0.6 mg/dL (ref 0.2–1.2)
Total Protein: 6.9 g/dL (ref 6.0–8.3)

## 2016-12-25 LAB — LIPID PANEL
CHOL/HDL RATIO: 4
CHOLESTEROL: 258 mg/dL — AB (ref 0–200)
HDL: 73.6 mg/dL (ref >39.00)
LDL CALC: 152 mg/dL — AB (ref 0–99)
NonHDL: 184.57
TRIGLYCERIDES: 163 mg/dL — AB (ref 0.0–149.0)
VLDL: 32.6 mg/dL (ref 0.0–40.0)

## 2016-12-25 LAB — SEDIMENTATION RATE: SED RATE: 7 mm/h (ref 0–30)

## 2016-12-25 MED ORDER — MELOXICAM 15 MG PO TABS: 15.0000 mg | ORAL_TABLET | Freq: Every day | ORAL | 1 refills | Status: DC | PRN

## 2016-12-25 NOTE — Assessment & Plan Note (Signed)
Located to RLL as incidental finding on CT abdomen/pelvis. Overall she is low risk, but given second hand smoke exposure, will check low dose CT chest in December 2018. No alarm signs. She is a non smoker.

## 2016-12-25 NOTE — Progress Notes (Signed)
Subjective:    Patient ID: Erin Weiss, female    DOB: 07/15/1956, 61 y.o.   MRN: [id number]  HPI  Erin Weiss is a 61 year old female who presents today to establish care and discuss the problems mentioned below. Will obtain old records.  1) Hyperlipidemia: Diagnosed four years ago. She has been on high doses of Niacin in the past. She stopped taking this medication in October 2017. Her last cholesterol check was 2 years ago.   2) Pulmonary Nodule: Incidental finding on CT abdomen/pelvis from December 2017 for acute cholelithiasis. Located to right lower lobe. She denies history of tobacco abuse but was exposed to second hand smoke during most of her childhood. Her father was a smoker and passed away of lung cancer. She denies cough, unexplained weight loss.   3) Arthritis: Located to fingers of bilateral hands, mostly to the left second digit. She has noticed swelling of her joints with pain. Most of her swelling is present to the PIP joints, with some to the DIP joints. She did notice a nodule to her right thumb which has decreased overall. She underwent testing for RA years ago which was negative. Her mother has a history of RA. She will take Aleve occasionally for increased pain and inflammation. This past weekend she was very active though mowing the lawn and washing her car. She has noticed some improvement with Aleve.  Review of Systems  Constitutional: Negative for unexpected weight change.  Respiratory: Negative for cough and shortness of breath.   Cardiovascular: Negative for chest pain.  Musculoskeletal: Positive for arthralgias and joint swelling.  Skin: Negative for color change.       Past Medical History:  Diagnosis Date  . Arthritis    hands  . GERD (gastroesophageal reflux disease)    RELATED TO GALLBLADDER  . Lung nodule    RECENTLY FOUND WHEN HAVING CT SCAN DONE ON 10-13-16 FOR GALLBLADDER ATTACK     Social History   Social History  . Marital status: Single     Spouse name: N/A  . Number of children: N/A  . Years of education: N/A   Occupational History  . Not on file.   Social History Main Topics  . Smoking status: Never Smoker  . Smokeless tobacco: Never Used  . Alcohol use Yes     Comment: occ  . Drug use: No  . Sexual activity: Not on file   Other Topics Concern  . Not on file   Social History Narrative   Single.   2 children   Works as an Print production planner   Enjoys spending time outdoors.    Past Surgical History:  Procedure Laterality Date  . ABDOMINAL HYSTERECTOMY    . CESAREAN SECTION     x2  . CHOLECYSTECTOMY N/A 11/03/2016   Procedure: LAPAROSCOPIC CHOLECYSTECTOMY;  Surgeon: Lattie Haw, MD;  Location: [facility] ORS;  Service: General;  Laterality: N/A;    Family History  Problem Relation Age of Onset  . Lung cancer Father   . Arthritis Maternal Grandmother     Allergies  Allergen Reactions  . Penicillins Swelling and Rash    Rash and swelling of hands Has patient had a PCN reaction causing immediate rash, facial/tongue/throat swelling, SOB or lightheadedness with hypotension: Yes Has patient had a PCN reaction causing severe rash involving mucus membranes or skin necrosis: No Has patient had a PCN reaction that required hospitalization No Has patient had a PCN reaction occurring within the last 10  years: No If all of the above answers are "NO", then may proceed with Cephalosporin use.     No current outpatient prescriptions on file prior to visit.   No current facility-administered medications on file prior to visit.     BP 110/76   Pulse 72   Temp 98.1 F (36.7 C) (Oral)   Ht 5\' 4"  (1.626 m)   Wt 184 lb 1.9 oz (83.5 kg)   SpO2 99%   BMI 31.60 kg/m    Objective:   Physical Exam  Constitutional: She appears well-nourished.  Neck: Neck supple.  Cardiovascular: Normal rate and regular rhythm.   Pulmonary/Chest: Effort normal and breath sounds normal.  Musculoskeletal:  Mild to moderate  swelling of several PIP joints to bilateral hands. Herbden's nodes noted.  Skin: Skin is warm and dry.  Psychiatric: She has a normal mood and affect.          Assessment & Plan:

## 2016-12-25 NOTE — Assessment & Plan Note (Signed)
Located to bilateral hands. Exam today mostly representative of osteoarthritis, but given FH of RA and increase in stiffness/swelling, will retest and include xrays. Rx for Meloxicam sent to pharmacy.

## 2016-12-25 NOTE — Assessment & Plan Note (Addendum)
No recent lipid panel per patient. Lipids pending. Consider healthy lifestyle changes as initial treatment if possible.

## 2016-12-25 NOTE — Progress Notes (Signed)
Pre visit review using our clinic review tool, if applicable. No additional management support is needed unless otherwise documented below in the visit note. 

## 2016-12-25 NOTE — Patient Instructions (Signed)
Complete lab work and xray prior to leaving today. I will notify you of your results once received.   You may take Meloxicam 15 mg tablets for pain and inflammation to joints of fingers. Do not take Aleve, Ibuprofen, Motrin with this medication.  We will complete a low dose CT scan of your lungs this December.  It was a pleasure to meet you today! Please don't hesitate to call me with any questions. Welcome to [facility]!

## 2016-12-26 LAB — RHEUMATOID FACTOR

## 2017-01-10 DIAGNOSIS — Z1231 Encounter for screening mammogram for malignant neoplasm of breast: Secondary | ICD-10-CM | POA: Diagnosis not present

## 2017-02-04 IMAGING — CT CT ABD-PELV W/ CM
2 of 5 series · 15 of 46 positions shown, 17 images · IV contrast (APPLIED)
Comparison: Ultrasound 07/30/2012

ADDENDUM:
5 mm subpleural right lower lobe pulmonary nodule. No follow-up
needed if patient is low-risk. Non-contrast chest CT can be
considered in 12 months if patient is high-risk. This recommendation
follows the consensus statement: Guidelines for Management of
Incidental Pulmonary Nodules Detected on CT Images: From the
CLINICAL DATA: Right upper quadrant abdominal pain and epigastric
pain

EXAM:
CT ABDOMEN AND PELVIS WITH CONTRAST
TECHNIQUE: Multidetector CT imaging of the abdomen and pelvis was performed
using the standard protocol following bolus administration of
intravenous contrast.
CONTRAST:  100mL C0EMNU-7KK IOPAMIDOL (C0EMNU-7KK) INJECTION 61%

[Series 2: routine abd/pel with · axial · 0.88mm/px · z∈[-934,-464]mm · 12 of 106 slices shown, 14 images]
[im 6/106  soft-tissue]
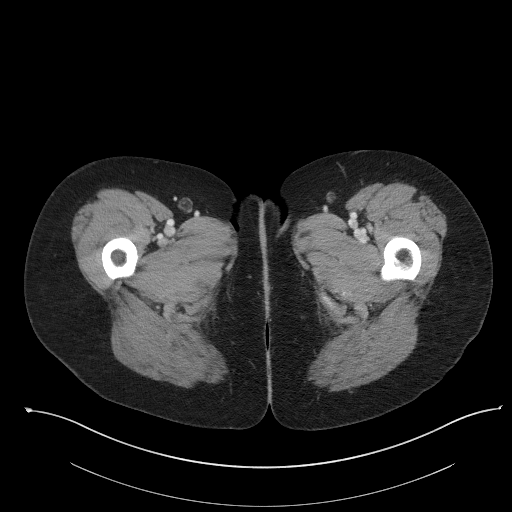
[im 6/106  bone]
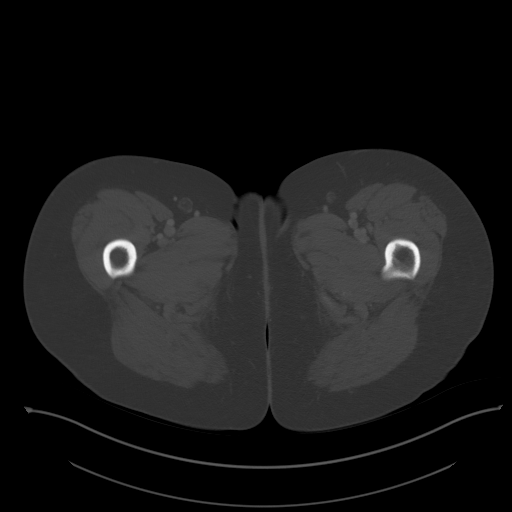
[im 17/106  soft-tissue]
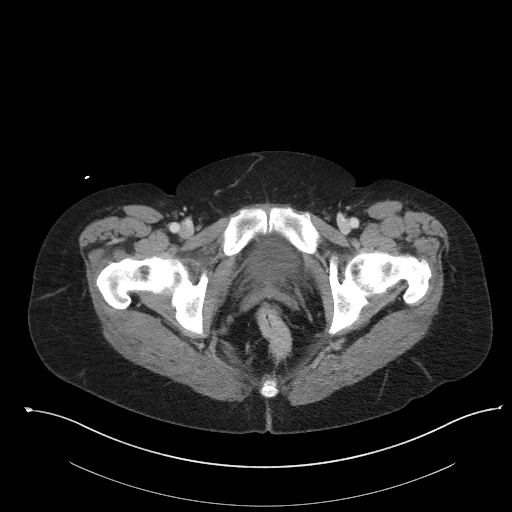
[im 23/106  soft-tissue]
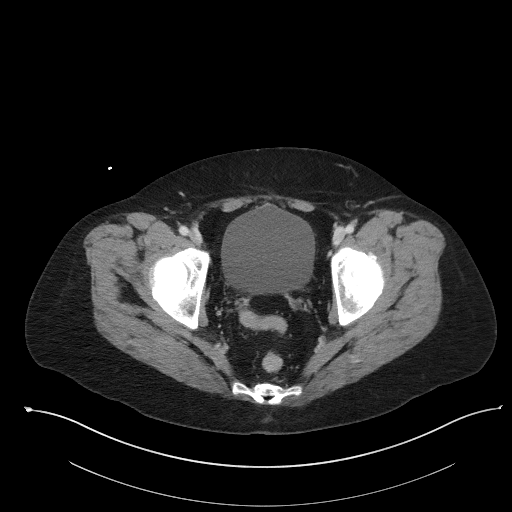
[im 34/106  soft-tissue]
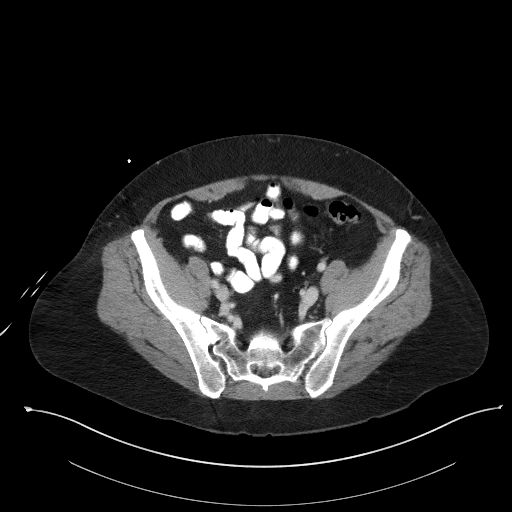
[im 39/106  soft-tissue]
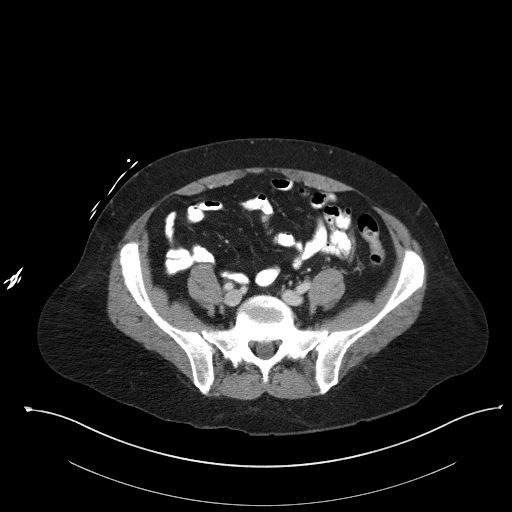
[im 50/106  soft-tissue]
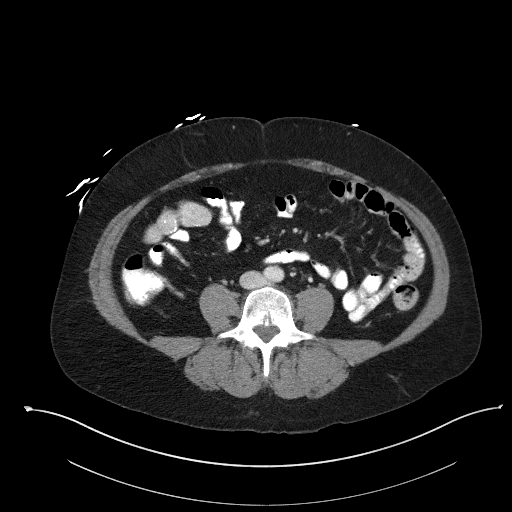
[im 56/106  soft-tissue]
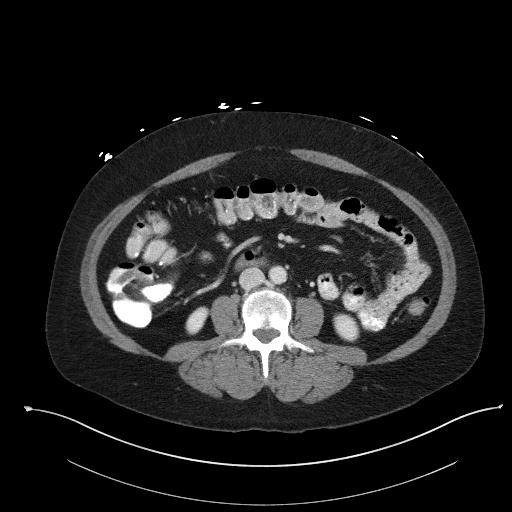
[im 67/106  soft-tissue]
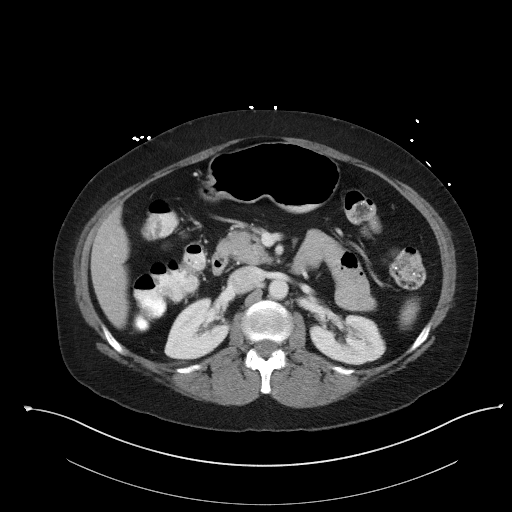
[im 72/106  soft-tissue]
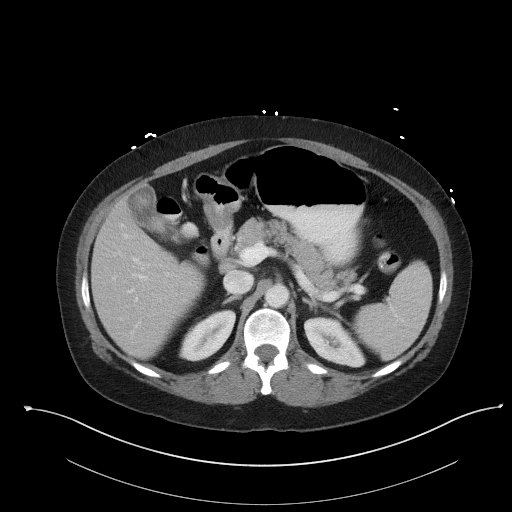
[im 72/106  bone]
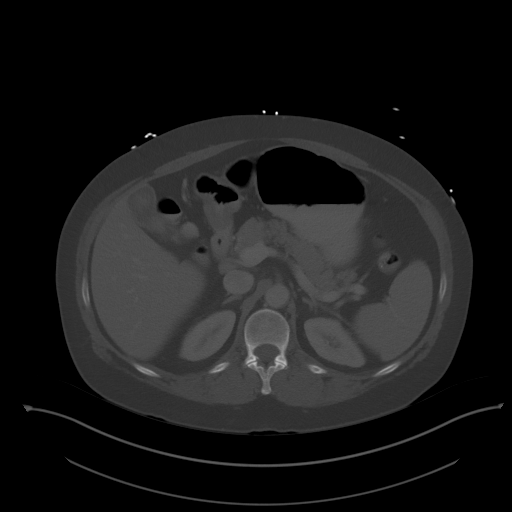
[im 83/106  soft-tissue]
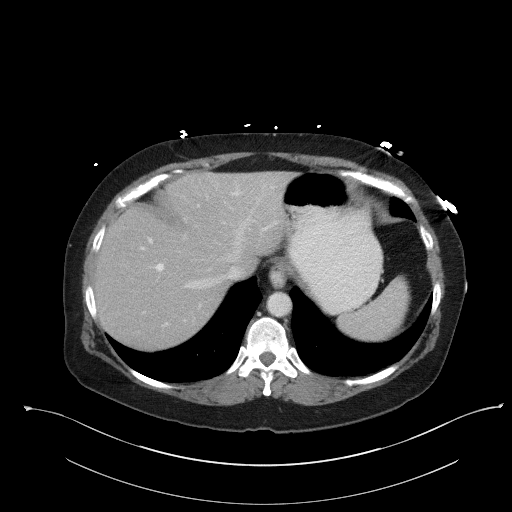
[im 89/106  soft-tissue]
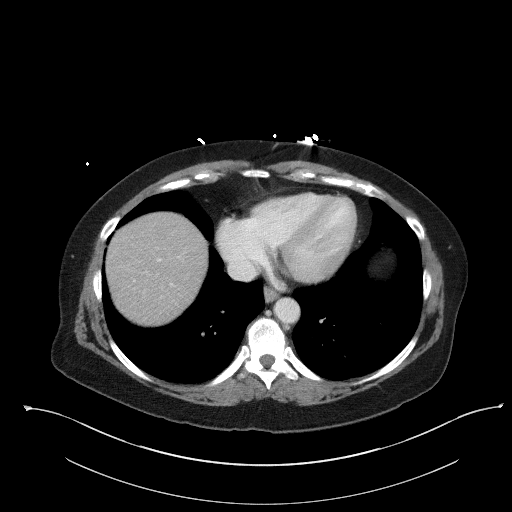
[im 100/106  soft-tissue]
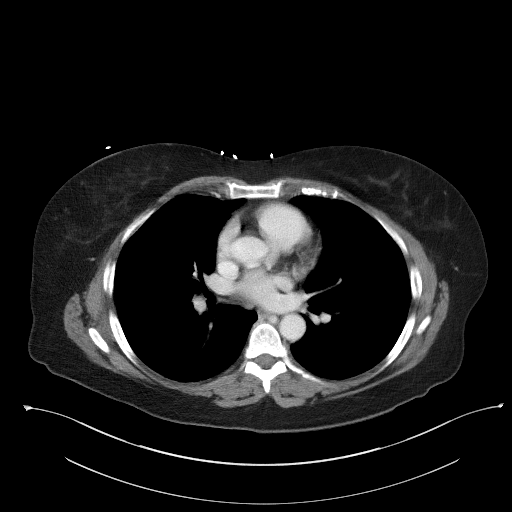

[Series 5: coronal st · coronal · 0.74mm/px · 3 of 93 slices shown]
[im 31/93  soft-tissue]
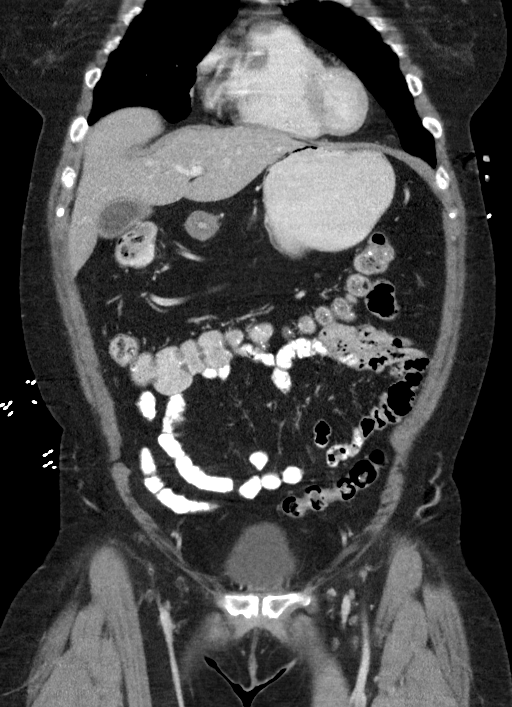
[im 41/93  soft-tissue]
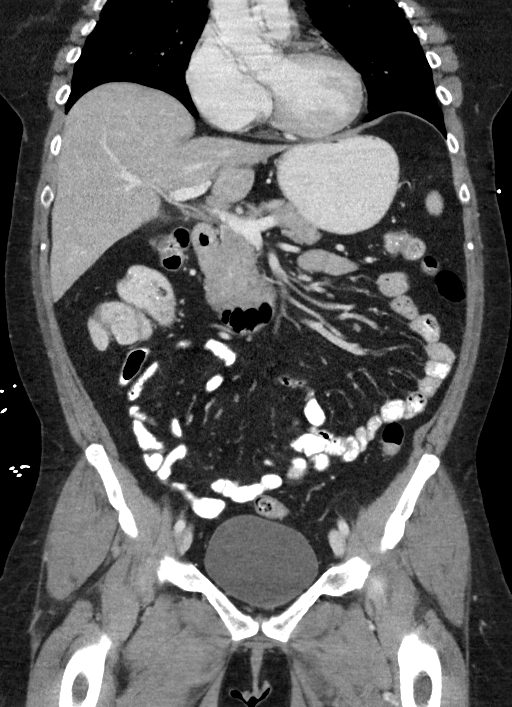
[im 52/93  soft-tissue]
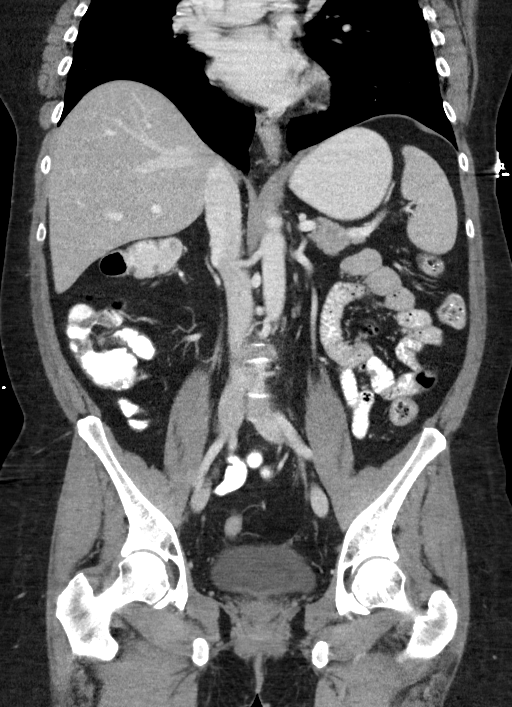

[15 of 46 positions shown; findings below may reference images not displayed]

FINDINGS: Lower chest: No acute consolidation or pleural effusion. In the
subpleural right lower lobe there is a 5 x 4 mm nodule (5 mm mean
diameter) on image 13 of series 4. No other lung nodules are
visualized. Heart size nonenlarged.

Hepatobiliary: There is fatty infiltration of the liver. No focal
hepatic abnormality. Multiple large calcified gallstones. No wall
thickening. No biliary dilatation.

Pancreas: Unremarkable. No pancreatic ductal dilatation or
surrounding inflammatory changes.

Spleen: Normal in size without focal abnormality.

Adrenals/Urinary Tract: Adrenal glands are unremarkable. Kidneys are
normal, without renal calculi, focal lesion, or hydronephrosis.
Bladder is unremarkable.

Stomach/Bowel: Stomach is within normal limits. Appendix appears
normal. No evidence of bowel wall thickening, distention, or
inflammatory changes. Diverticular disease of the sigmoid colon
without wall thickening or acute inflammation.

Vascular/Lymphatic: Aortic atherosclerosis. No enlarged abdominal or
pelvic lymph nodes.

Reproductive: Status post hysterectomy. No adnexal masses.

Other: No abdominal wall hernia or abnormality. No abdominopelvic
ascites.

Musculoskeletal: No acute or significant osseous findings.
IMPRESSION: 1. No CT evidence for acute intra- abdominal or pelvic pathology
2. Large calcified gallstones without wall thickening or biliary
dilatation
3. Fatty infiltration of the liver
4. Sigmoid colon diverticular disease without acute inflammation

## 2017-04-05 DIAGNOSIS — L821 Other seborrheic keratosis: Secondary | ICD-10-CM | POA: Diagnosis not present

## 2017-04-05 DIAGNOSIS — Z85828 Personal history of other malignant neoplasm of skin: Secondary | ICD-10-CM | POA: Diagnosis not present

## 2017-04-05 DIAGNOSIS — L570 Actinic keratosis: Secondary | ICD-10-CM | POA: Diagnosis not present

## 2017-04-05 DIAGNOSIS — L812 Freckles: Secondary | ICD-10-CM | POA: Diagnosis not present

## 2017-04-05 DIAGNOSIS — L578 Other skin changes due to chronic exposure to nonionizing radiation: Secondary | ICD-10-CM | POA: Diagnosis not present

## 2017-06-26 ENCOUNTER — Encounter: Payer: Self-pay | Admitting: Primary Care

## 2017-06-26 ENCOUNTER — Ambulatory Visit (INDEPENDENT_AMBULATORY_CARE_PROVIDER_SITE_OTHER): Payer: BLUE CROSS/BLUE SHIELD | Admitting: Primary Care

## 2017-06-26 VITALS — BP 122/76 | HR 73 | Temp 98.3°F | Ht 64.0 in | Wt 193.0 lb

## 2017-06-26 DIAGNOSIS — Z23 Encounter for immunization: Secondary | ICD-10-CM

## 2017-06-26 DIAGNOSIS — E785 Hyperlipidemia, unspecified: Secondary | ICD-10-CM

## 2017-06-26 DIAGNOSIS — K219 Gastro-esophageal reflux disease without esophagitis: Secondary | ICD-10-CM | POA: Insufficient documentation

## 2017-06-26 DIAGNOSIS — R1084 Generalized abdominal pain: Secondary | ICD-10-CM

## 2017-06-26 DIAGNOSIS — R911 Solitary pulmonary nodule: Secondary | ICD-10-CM | POA: Diagnosis not present

## 2017-06-26 DIAGNOSIS — Z0001 Encounter for general adult medical examination with abnormal findings: Secondary | ICD-10-CM | POA: Diagnosis not present

## 2017-06-26 LAB — LIPID PANEL
Cholesterol: 234 mg/dL — ABNORMAL HIGH (ref 0–200)
HDL: 62.1 mg/dL
LDL Cholesterol: 140 mg/dL — ABNORMAL HIGH (ref 0–99)
NonHDL: 171.59
Total CHOL/HDL Ratio: 4
Triglycerides: 160 mg/dL — ABNORMAL HIGH (ref 0.0–149.0)
VLDL: 32 mg/dL (ref 0.0–40.0)

## 2017-06-26 LAB — COMPREHENSIVE METABOLIC PANEL
ALK PHOS: 62 U/L (ref 39–117)
ALT: 17 U/L (ref 0–35)
AST: 18 U/L (ref 0–37)
Albumin: 4.3 g/dL (ref 3.5–5.2)
BILIRUBIN TOTAL: 0.6 mg/dL (ref 0.2–1.2)
BUN: 13 mg/dL (ref 6–23)
CO2: 32 meq/L (ref 19–32)
Calcium: 10 mg/dL (ref 8.4–10.5)
Chloride: 104 mEq/L (ref 96–112)
Creatinine, Ser: 0.84 mg/dL (ref 0.40–1.20)
GFR: 73.81 mL/min (ref >60.00)
GLUCOSE: 103 mg/dL — AB (ref 70–99)
Potassium: 4.4 mEq/L (ref 3.5–5.1)
Sodium: 141 mEq/L (ref 135–145)
TOTAL PROTEIN: 7.1 g/dL (ref 6.0–8.3)

## 2017-06-26 LAB — CBC WITH DIFFERENTIAL/PLATELET
BASOS ABS: 0 10*3/uL (ref 0.0–0.1)
Basophils Relative: 0.9 % (ref 0.0–3.0)
EOS ABS: 0.1 10*3/uL (ref 0.0–0.7)
Eosinophils Relative: 2.1 % (ref 0.0–5.0)
HCT: 39.9 % (ref 36.0–46.0)
Hemoglobin: 13.7 g/dL (ref 12.0–15.0)
LYMPHS ABS: 1.7 10*3/uL (ref 0.7–4.0)
LYMPHS PCT: 35 % (ref 12.0–46.0)
MCHC: 34.4 g/dL (ref 30.0–36.0)
MCV: 91 fl (ref 78.0–100.0)
MONO ABS: 0.3 10*3/uL (ref 0.1–1.0)
MONOS PCT: 6.5 % (ref 3.0–12.0)
NEUTROS ABS: 2.7 10*3/uL (ref 1.4–7.7)
NEUTROS PCT: 55.5 % (ref 43.0–77.0)
PLATELETS: 233 10*3/uL (ref 150.0–400.0)
RBC: 4.38 Mil/uL (ref 3.87–5.11)
RDW: 12.7 % (ref 11.5–15.5)
WBC: 4.9 10*3/uL (ref 4.0–10.5)

## 2017-06-26 LAB — HEMOGLOBIN A1C: HEMOGLOBIN A1C: 5.4 % (ref 4.6–6.5)

## 2017-06-26 LAB — H. PYLORI ANTIBODY, IGG: H Pylori IgG: NEGATIVE

## 2017-06-26 MED ORDER — OMEPRAZOLE 40 MG PO CPDR: 40.0000 mg | DELAYED_RELEASE_CAPSULE | Freq: Every day | ORAL | 1 refills | Status: DC

## 2017-06-26 MED ORDER — ZOSTER VAC RECOMB ADJUVANTED 50 MCG/0.5ML IM SUSR: INTRAMUSCULAR | 1 refills | Status: DC

## 2017-06-26 NOTE — Assessment & Plan Note (Signed)
Persistent symptoms x 2-3 months. Gall bladder removed in January 2018. Start Omeprazole 40 mg x 4-8 weeks, then work on weaning back down to 20 mg or use H2 Blocker. Handout provided regarding trigger foods.

## 2017-06-26 NOTE — Patient Instructions (Addendum)
Complete lab work prior to leaving today. I will notify you of your results once received.   You were provided with a tetanus vaccination which will cover you for 10 years.  Start omeprazole 40 mg capsules for heartburn. Take 1 capsule by mouth, everyday. Do this for 4-8 weeks then we'll try to wean you back down.   Take the Shingrix vaccination to your pharmacy around October.  Follow up in 1 year for annual exam or sooner if needed.  It was a pleasure to see you today!

## 2017-06-26 NOTE — Progress Notes (Signed)
Subjective:    Patient ID: Erin Weiss, female    DOB: 01/08/1956, 61 y.o.   MRN: [id number]  HPI  Erin Weiss is a 61 year old female who presents today for complete physical.  1) GERD: Occurs with eating and drinking that has been present intermittently for the past 2-3 months. She's been taking Tums twice daily, everyday. She's tried other OTC products (can't remember the names) without improvement. She denies vomiting, diarrhea, constipation, bloody stools, fevers. She is frustrated as she's gaining weight.   Immunizations: -Tetanus: Due today. Completed over 10 years ago. -Influenza: Due this season.  -Shingles: Completed in 2014. Interested in [employer].   Diet: She endorses a healthy diet. She's eating much smaller meals. Breakfast: Packaged muffin, pancakes on the weekends, sometimes skips Lunch: Sandwich, chips, now drinking mostly Boost. Dinner: Pasta, sandwich, meat, some vegetables, potatoes, beans Snacks: Occasional popcorn Desserts: Occasionally  Beverages: Coffee, water, Gatorade, occasionally  Exercise: She is not exercising. Eye exam: Has not completed in 20 years. Dental exam: Has not completed in 5-6 years, Colonoscopy: Completed in October 2017, due in 2027 Pap Smear: Hysterectomy Mammogram: Completed in 2018, normal.   Review of Systems  Constitutional: Negative for fever and unexpected weight change.  HENT: Negative for rhinorrhea.   Respiratory: Negative for cough and shortness of breath.   Cardiovascular: Negative for chest pain.  Gastrointestinal: Positive for abdominal pain. Negative for blood in stool and constipation.       GERD with drinking and eating. Occasional diarrhea  Genitourinary: Negative for difficulty urinating and menstrual problem.  Musculoskeletal: Negative for arthralgias and myalgias.  Skin: Negative for rash.  Allergic/Immunologic: Negative for environmental allergies.  Neurological: Negative for dizziness, numbness and  headaches.  Psychiatric/Behavioral: The patient is not nervous/anxious.        Past Medical History:  Diagnosis Date  . Arthritis    hands  . GERD (gastroesophageal reflux disease)    RELATED TO GALLBLADDER  . Lung nodule    RECENTLY FOUND WHEN HAVING CT SCAN DONE ON 10-13-16 FOR GALLBLADDER ATTACK     Social History   Social History  . Marital status: Single    Spouse name: N/A  . Number of children: N/A  . Years of education: N/A   Occupational History  . Not on file.   Social History Main Topics  . Smoking status: Never Smoker  . Smokeless tobacco: Never Used  . Alcohol use Yes     Comment: occ  . Drug use: No  . Sexual activity: Not on file   Other Topics Concern  . Not on file   Social History Narrative   Single.   2 children   Works as an Print production planner   Enjoys spending time outdoors.    Past Surgical History:  Procedure Laterality Date  . ABDOMINAL HYSTERECTOMY    . CESAREAN SECTION     x2  . CHOLECYSTECTOMY N/A 11/03/2016   Procedure: LAPAROSCOPIC CHOLECYSTECTOMY;  Surgeon: Lattie Haw, MD;  Location: [facility] ORS;  Service: General;  Laterality: N/A;    Family History  Problem Relation Age of Onset  . Lung cancer Father   . Arthritis Maternal Grandmother     Allergies  Allergen Reactions  . Penicillins Swelling and Rash    Rash and swelling of hands Has patient had a PCN reaction causing immediate rash, facial/tongue/throat swelling, SOB or lightheadedness with hypotension: Yes Has patient had a PCN reaction causing severe rash involving mucus membranes or skin necrosis:  No Has patient had a PCN reaction that required hospitalization No Has patient had a PCN reaction occurring within the last 10 years: No If all of the above answers are "NO", then may proceed with Cephalosporin use.     Current Outpatient Prescriptions on File Prior to Visit  Medication Sig Dispense Refill  . meloxicam (MOBIC) 15 MG tablet Take 1 tablet (15 mg total)  by mouth daily as needed for pain. 30 tablet 1   No current facility-administered medications on file prior to visit.     BP 122/76   Pulse 73   Temp 98.3 F (36.8 C) (Oral)   Ht 5\' 4"  (1.626 m)   Wt 193 lb (87.5 kg)   SpO2 96%   BMI 33.13 kg/m    Objective:   Physical Exam  Constitutional: She is oriented to person, place, and time. She appears well-nourished.  HENT:  Right Ear: Tympanic membrane and ear canal normal.  Left Ear: Tympanic membrane and ear canal normal.  Nose: Nose normal.  Mouth/Throat: Oropharynx is clear and moist.  Eyes: Pupils are equal, round, and reactive to light. Conjunctivae and EOM are normal.  Neck: Neck supple. No thyromegaly present.  Cardiovascular: Normal rate and regular rhythm.   No murmur heard. Pulmonary/Chest: Effort normal and breath sounds normal. She has no rales.  Abdominal: Soft. Normal appearance and bowel sounds are normal. There is tenderness in the right upper quadrant, epigastric area and left upper quadrant.  Musculoskeletal: Normal range of motion.  Lymphadenopathy:    She has no cervical adenopathy.  Neurological: She is alert and oriented to person, place, and time. She has normal reflexes. No cranial nerve deficit.  Skin: Skin is warm and dry. No rash noted.  Psychiatric: She has a normal mood and affect.          Assessment & Plan:  Abdominal Pain:  Suspect secondary to uncontrolled GERD. Will treat with omeprazole 40 mg. Check H pylori, CBC, CMP today. No alarm signs. Consider IBS in combination with GERD.  Morrie Sheldon, NP

## 2017-06-26 NOTE — Assessment & Plan Note (Signed)
Td due, provided today. Rx for Shingrix provided.  Mammogram and Colonoscopy UTD. Discussed the importance of a healthy diet and regular exercise in order for weight loss, and to reduce the risk of other medical problems. Exam with abdominal tenderness, following with labs. Preventative labs pending. Follow up in 1 year.

## 2017-06-26 NOTE — Assessment & Plan Note (Signed)
Lipid panel pending. Discussed the importance of a healthy diet and regular exercise in order for weight loss, and to reduce the risk of other medical problems.  

## 2017-06-26 NOTE — Assessment & Plan Note (Signed)
Due for repeat imaging in December 2018. Reminded patient today.

## 2017-06-26 NOTE — Addendum Note (Signed)
Addended by: Tawnya Crook on: 06/26/2017 12:27 PM   Modules accepted: Orders

## 2017-08-03 DIAGNOSIS — Z23 Encounter for immunization: Secondary | ICD-10-CM | POA: Diagnosis not present

## 2017-08-07 ENCOUNTER — Encounter: Payer: Self-pay | Admitting: Primary Care

## 2017-08-07 DIAGNOSIS — K219 Gastro-esophageal reflux disease without esophagitis: Secondary | ICD-10-CM

## 2017-08-09 MED ORDER — OMEPRAZOLE 20 MG PO CPDR: 20.0000 mg | DELAYED_RELEASE_CAPSULE | Freq: Every day | ORAL | 0 refills | Status: DC

## 2017-08-15 ENCOUNTER — Other Ambulatory Visit: Payer: Self-pay | Admitting: Primary Care

## 2017-08-15 DIAGNOSIS — K219 Gastro-esophageal reflux disease without esophagitis: Secondary | ICD-10-CM

## 2017-08-15 NOTE — Telephone Encounter (Signed)
Ok to refill? Electronically refill request for omeprazole (PRILOSEC) 40 MG capsule  Last prescribed and seen on 06/26/2017.

## 2017-08-15 NOTE — Telephone Encounter (Signed)
Please check on patient, how's she doing on omeprazole 40 mg for heartburn symptoms? If better then I'd like to try her at the 20 mg dose, I'll send this to her pharmacy. Please let me know.

## 2017-08-17 NOTE — Telephone Encounter (Signed)
This was already change to 20 mg last week. Patient is doing fine.

## 2017-10-01 ENCOUNTER — Telehealth: Payer: Self-pay | Admitting: Primary Care

## 2017-10-01 DIAGNOSIS — R918 Other nonspecific abnormal finding of lung field: Secondary | ICD-10-CM

## 2017-10-01 NOTE — Telephone Encounter (Addendum)
-----   Message from Doreene NestKatherine K Somaya Grassi, NP sent at 12/25/2016 12:57 PM EST ----- Regarding: Low dose CT  Please notify patient that she is due for the low dose CT scan for check up of the right lung nodule. Please notify me if she's agreeable and I'll get this set up.

## 2017-10-01 NOTE — Telephone Encounter (Signed)
Noted, CT scan ordered.  ?

## 2017-10-01 NOTE — Telephone Encounter (Signed)
Spoken and notified patient of Kate's comments. Patient is agreeable to the CT scan.

## 2017-10-02 ENCOUNTER — Other Ambulatory Visit: Payer: Self-pay | Admitting: Primary Care

## 2017-10-02 DIAGNOSIS — M2540 Effusion, unspecified joint: Secondary | ICD-10-CM

## 2017-10-02 DIAGNOSIS — M2560 Stiffness of unspecified joint, not elsewhere classified: Secondary | ICD-10-CM

## 2017-10-12 ENCOUNTER — Ambulatory Visit: Payer: BLUE CROSS/BLUE SHIELD

## 2017-10-16 ENCOUNTER — Ambulatory Visit
Admission: RE | Admit: 2017-10-16 | Discharge: 2017-10-16 | Disposition: A | Discharge status: Home / Self Care | Payer: BLUE CROSS/BLUE SHIELD | Source: Ambulatory Visit | Attending: Primary Care | Admitting: Primary Care

## 2017-10-16 DIAGNOSIS — R911 Solitary pulmonary nodule: Secondary | ICD-10-CM | POA: Diagnosis not present

## 2017-10-16 DIAGNOSIS — R918 Other nonspecific abnormal finding of lung field: Secondary | ICD-10-CM | POA: Insufficient documentation

## 2017-11-03 ENCOUNTER — Other Ambulatory Visit: Payer: Self-pay | Admitting: Primary Care

## 2017-11-03 DIAGNOSIS — K219 Gastro-esophageal reflux disease without esophagitis: Secondary | ICD-10-CM

## 2018-03-13 DIAGNOSIS — Z6833 Body mass index (BMI) 33.0-33.9, adult: Secondary | ICD-10-CM | POA: Diagnosis not present

## 2018-03-13 DIAGNOSIS — Z01419 Encounter for gynecological examination (general) (routine) without abnormal findings: Secondary | ICD-10-CM | POA: Diagnosis not present

## 2018-03-13 DIAGNOSIS — Z1231 Encounter for screening mammogram for malignant neoplasm of breast: Secondary | ICD-10-CM | POA: Diagnosis not present

## 2018-04-15 DIAGNOSIS — Z713 Dietary counseling and surveillance: Secondary | ICD-10-CM | POA: Diagnosis not present

## 2018-04-15 DIAGNOSIS — Z6833 Body mass index (BMI) 33.0-33.9, adult: Secondary | ICD-10-CM | POA: Diagnosis not present

## 2018-04-15 DIAGNOSIS — Z13820 Encounter for screening for osteoporosis: Secondary | ICD-10-CM | POA: Diagnosis not present

## 2018-04-24 ENCOUNTER — Other Ambulatory Visit: Payer: Self-pay | Admitting: Primary Care

## 2018-04-24 DIAGNOSIS — K219 Gastro-esophageal reflux disease without esophagitis: Secondary | ICD-10-CM

## 2018-05-15 DIAGNOSIS — E6609 Other obesity due to excess calories: Secondary | ICD-10-CM | POA: Diagnosis not present

## 2018-05-15 DIAGNOSIS — Z6831 Body mass index (BMI) 31.0-31.9, adult: Secondary | ICD-10-CM | POA: Diagnosis not present

## 2018-05-29 DIAGNOSIS — M71341 Other bursal cyst, right hand: Secondary | ICD-10-CM | POA: Diagnosis not present

## 2018-05-29 DIAGNOSIS — D2371 Other benign neoplasm of skin of right lower limb, including hip: Secondary | ICD-10-CM | POA: Diagnosis not present

## 2018-05-29 DIAGNOSIS — Z85828 Personal history of other malignant neoplasm of skin: Secondary | ICD-10-CM | POA: Diagnosis not present

## 2018-05-29 DIAGNOSIS — L821 Other seborrheic keratosis: Secondary | ICD-10-CM | POA: Diagnosis not present

## 2018-05-29 DIAGNOSIS — Z1283 Encounter for screening for malignant neoplasm of skin: Secondary | ICD-10-CM | POA: Diagnosis not present

## 2018-05-29 DIAGNOSIS — L570 Actinic keratosis: Secondary | ICD-10-CM | POA: Diagnosis not present

## 2018-06-12 DIAGNOSIS — Z6830 Body mass index (BMI) 30.0-30.9, adult: Secondary | ICD-10-CM | POA: Diagnosis not present

## 2018-06-12 DIAGNOSIS — E6609 Other obesity due to excess calories: Secondary | ICD-10-CM | POA: Diagnosis not present

## 2018-08-10 DIAGNOSIS — K0889 Other specified disorders of teeth and supporting structures: Secondary | ICD-10-CM | POA: Diagnosis not present

## 2018-09-13 DIAGNOSIS — Z23 Encounter for immunization: Secondary | ICD-10-CM | POA: Diagnosis not present

## 2018-10-15 ENCOUNTER — Other Ambulatory Visit: Payer: Self-pay | Admitting: Primary Care

## 2018-10-15 DIAGNOSIS — K219 Gastro-esophageal reflux disease without esophagitis: Secondary | ICD-10-CM

## 2018-11-25 DIAGNOSIS — R309 Painful micturition, unspecified: Secondary | ICD-10-CM | POA: Diagnosis not present

## 2018-11-25 DIAGNOSIS — N390 Urinary tract infection, site not specified: Secondary | ICD-10-CM | POA: Diagnosis not present

## 2018-12-02 ENCOUNTER — Ambulatory Visit: Payer: BLUE CROSS/BLUE SHIELD | Admitting: Family Medicine

## 2018-12-02 ENCOUNTER — Encounter: Payer: Self-pay | Admitting: Family Medicine

## 2018-12-02 VITALS — BP 130/82 | HR 88 | Temp 100.0°F | Ht 64.0 in | Wt 163.5 lb

## 2018-12-02 DIAGNOSIS — J0110 Acute frontal sinusitis, unspecified: Secondary | ICD-10-CM

## 2018-12-02 DIAGNOSIS — J069 Acute upper respiratory infection, unspecified: Secondary | ICD-10-CM

## 2018-12-02 LAB — POCT INFLUENZA A/B
Influenza A, POC: NEGATIVE
Influenza B, POC: NEGATIVE

## 2018-12-02 MED ORDER — DOXYCYCLINE HYCLATE 100 MG PO TABS: 100.0000 mg | ORAL_TABLET | Freq: Two times a day (BID) | ORAL | 0 refills | Status: AC

## 2018-12-02 NOTE — Patient Instructions (Addendum)
Based on your symptoms, it looks like you have a virus. If you not better in next 1-2 days then start the antibiotic as it may be a sinus infection.   Antibiotics are not need for a viral infection but the following will help:   1. Drink plenty of fluids 2. Get lots of rest  Sinus Congestion 1) Neti Pot (Saline rinse) -- 2 times day -- if tolerated 2) Flonase (Store Brand ok) - once daily 3) Over the counter congestion medications  Cough 1) Cough drops can be helpful 2) Nyquil (or nighttime cough medication) 3) Honey is proven to be one of the best cough medications   Sore Throat 1) Honey as above, cough drops 2) Ibuprofen or Aleve can be helpful 3) Salt water Gargles  If you develop fevers (Temperature >100.4), chills, worsening symptoms or symptoms lasting longer than 10 days return to clinic.

## 2018-12-02 NOTE — Progress Notes (Signed)
Subjective:     Erin Weiss is a 63 y.o. female presenting for Generalized Body Aches (Started not feeling well on Friday 11/29/2018. Fatigue, body aches, a lot of post nasal drainage, head feels stopped up. Not sure if she had fever.)     URI   This is a new problem. The current episode started in the past 7 days. The problem has been rapidly worsening. There has been no fever. Associated symptoms include congestion, headaches, nausea and sinus pain. Pertinent negatives include no abdominal pain, chest pain, coughing, diarrhea, ear pain, neck pain, plugged ear sensation, rhinorrhea, sore throat, vomiting or wheezing. She has tried nothing for the symptoms.     Review of Systems  Constitutional: Positive for chills and fatigue. Negative for fever.  HENT: Positive for congestion, postnasal drip and sinus pain. Negative for ear pain, rhinorrhea and sore throat.   Respiratory: Negative for cough and wheezing.   Cardiovascular: Negative for chest pain.  Gastrointestinal: Positive for nausea. Negative for abdominal pain, diarrhea and vomiting.  Musculoskeletal: Positive for arthralgias and myalgias. Negative for neck pain.  Neurological: Positive for headaches.     Social History   Tobacco Use  Smoking Status Never Smoker  Smokeless Tobacco Never Used        Objective:    BP Readings from Last 3 Encounters:  12/02/18 130/82  06/26/17 122/76  12/25/16 110/76   Wt Readings from Last 3 Encounters:  12/02/18 163 lb 8 oz (74.2 kg)  06/26/17 193 lb (87.5 kg)  12/25/16 184 lb 1.9 oz (83.5 kg)    BP 130/82   Pulse 88   Temp 100 F (37.8 C)   Ht 5\' 4"  (1.626 m)   Wt 163 lb 8 oz (74.2 kg)   SpO2 97%   BMI 28.06 kg/m    Physical Exam Constitutional:      General: She is not in acute distress.    Appearance: She is well-developed. She is not diaphoretic.  HENT:     Head: Normocephalic and atraumatic.     Right Ear: Tympanic membrane and ear canal normal.     Left  Ear: Tympanic membrane and ear canal normal.     Nose: Mucosal edema and rhinorrhea present.     Right Sinus: Frontal sinus tenderness present. No maxillary sinus tenderness.     Left Sinus: Frontal sinus tenderness present. No maxillary sinus tenderness.     Mouth/Throat:     Pharynx: Uvula midline. Posterior oropharyngeal erythema present. No oropharyngeal exudate.     Tonsils: Swelling: 0 on the right. 0 on the left.  Eyes:     General: No scleral icterus.    Conjunctiva/sclera: Conjunctivae normal.  Neck:     Musculoskeletal: Neck supple.  Cardiovascular:     Rate and Rhythm: Normal rate and regular rhythm.     Heart sounds: Normal heart sounds. No murmur.  Pulmonary:     Effort: Pulmonary effort is normal. No respiratory distress.     Breath sounds: Normal breath sounds.  Lymphadenopathy:     Cervical: No cervical adenopathy.  Skin:    General: Skin is warm and dry.     Capillary Refill: Capillary refill takes less than 2 seconds.  Neurological:     Mental Status: She is alert.     Rapid flu: negative     Assessment & Plan:   Problem List Items Addressed This Visit    None    Visit Diagnoses    Viral URI    -  Primary   Relevant Orders   POCT Influenza A/B (Completed)   Acute non-recurrent frontal sinusitis       Relevant Medications   doxycycline (VIBRA-TABS) 100 MG tablet     Discussed that as on day 4 illness, likely viral, however given severity of symptoms if not improving in next 1-2 days Abx given to start.   Advised saline rinse and flonase  Return if symptoms worsen or fail to improve.  Lynnda ChildJessica R Cody, MD

## 2019-01-22 DIAGNOSIS — Z713 Dietary counseling and surveillance: Secondary | ICD-10-CM | POA: Diagnosis not present

## 2019-01-22 DIAGNOSIS — Z6827 Body mass index (BMI) 27.0-27.9, adult: Secondary | ICD-10-CM | POA: Diagnosis not present

## 2019-02-19 ENCOUNTER — Encounter: Payer: Self-pay | Admitting: Primary Care

## 2019-02-19 ENCOUNTER — Ambulatory Visit
Admission: RE | Admit: 2019-02-19 | Discharge: 2019-02-19 | Disposition: A | Discharge status: Home / Self Care | Payer: BLUE CROSS/BLUE SHIELD | Source: Ambulatory Visit | Attending: Primary Care | Admitting: Primary Care

## 2019-02-19 ENCOUNTER — Other Ambulatory Visit: Payer: Self-pay

## 2019-02-19 ENCOUNTER — Ambulatory Visit: Payer: Self-pay | Admitting: *Deleted

## 2019-02-19 ENCOUNTER — Ambulatory Visit: Payer: BLUE CROSS/BLUE SHIELD | Admitting: Primary Care

## 2019-02-19 ENCOUNTER — Telehealth: Payer: Self-pay | Admitting: *Deleted

## 2019-02-19 VITALS — BP 122/80 | HR 88 | Temp 98.4°F | Ht 64.0 in | Wt 164.0 lb

## 2019-02-19 DIAGNOSIS — M7122 Synovial cyst of popliteal space [Baker], left knee: Secondary | ICD-10-CM | POA: Diagnosis not present

## 2019-02-19 DIAGNOSIS — M79605 Pain in left leg: Secondary | ICD-10-CM | POA: Insufficient documentation

## 2019-02-19 DIAGNOSIS — M7989 Other specified soft tissue disorders: Secondary | ICD-10-CM

## 2019-02-19 NOTE — Patient Instructions (Signed)
Stop by the front desk and speak with either [facility] regarding your ultrasound.  I'll be in touch with your results once received.   It was a pleasure to see you today!

## 2019-02-19 NOTE — Telephone Encounter (Signed)
Called patient and screened her for Covid-19 and screening was negative. Advised patient to go ahead and come into the office when she arrives.

## 2019-02-19 NOTE — Telephone Encounter (Signed)
Noted, will evaluate. 

## 2019-02-19 NOTE — Telephone Encounter (Signed)
[name] from imaging called to let [name] NP know that the results of the doppler is in Epic.

## 2019-02-19 NOTE — Telephone Encounter (Signed)
Results were viewed at time of notification. I will contact the patient personally.

## 2019-02-19 NOTE — Telephone Encounter (Signed)
Left lower leg swelling noticed over the last week. The swelling has been decreasing at night until last night.The calf has some bruising and is slightly tender but no warmth or redness noticed. Knot behind the knee, also. Denies SOB/CP/fever. - for cough,also. No travels and no known exposures. Discussed SOB or CP to call 911. Stated understanding. This is an in office visit. Routing to PCP. Reason for Disposition . [1] MODERATE leg swelling (e.g., swelling extends up to knees) AND [2] new onset or worsening  Answer Assessment - Initial Assessment Questions 1. ONSET: "When did the swelling start?" (e.g., minutes, hours, days)    About one week ago 2. LOCATION: "What part of the leg is swollen?"  "Are both legs swollen or just one leg?"    Left lower leg starts below the knee down to the ankle. 3. SEVERITY: "How bad is the swelling?" (e.g., localized; mild, moderate, severe)  - Localized - small area of swelling localized to one leg  - MILD pedal edema - swelling limited to foot and ankle, pitting edema < 1/4 inch (6 mm) deep, rest and elevation eliminate most or all swelling  - MODERATE edema - swelling of lower leg to knee, pitting edema > 1/4 inch (6 mm) deep, rest and elevation only partially reduce swelling  - SEVERE edema - swelling extends above knee, facial or hand swelling present      moderate 4. REDNESS: "Does the swelling look red or infected?"    no 5. PAIN: "Is the swelling painful to touch?" If so, ask: "How painful is it?"   (Scale 1-10; mild, moderate or severe)     Was a 10, today a 2 6. FEVER: "Do you have a fever?" If so, ask: "What is it, how was it measured, and when did it start?"      no 7. CAUSE: "What do you think is causing the leg swelling?"     No idea 8. MEDICAL HISTORY: "Do you have a history of heart failure, kidney disease, liver failure, or cancer?"    no 9. RECURRENT SYMPTOM: "Have you had leg swelling before?" If so, ask: "When was the last time?" "What  happened that time?"    no 10. OTHER SYMPTOMS: "Do you have any other symptoms?" (e.g., chest pain, difficulty breathing)       no 11. PREGNANCY: "Is there any chance you are pregnant?" "When was your last menstrual period?"       no  Protocols used: LEG SWELLING AND EDEMA-A-AH

## 2019-02-19 NOTE — Progress Notes (Signed)
Subjective:    Patient ID: Erin Weiss, female    DOB: 1956/09/10, 63 y.o.   MRN: [id number]  HPI  Ms. Erin Weiss is a 63 year old female who presents today with a chief complaint of lower extremity swelling.  Her swelling is located to the left lower extremity from knee down to her ankle. Her swelling began five days ago with swelling and discomfort. Her pain has increased in discomfort and swelling during the day, some improvement with elevation in the evening until today.   She denies long travel, palpitations, shortness of breath, chest pain, knee pain. She is a non smoker, and is not on HRT. She is mostly inactive during the day but does work at home from a stand up desk. She's lost 40 pounds over the last year through dietary changes and exercise. She hasn't been able to do her typical exercise video this week, doesn't ever remember injuring herself.   Review of Systems  Respiratory: Negative for shortness of breath.   Cardiovascular: Negative for chest pain and palpitations.  Musculoskeletal: Negative for arthralgias.       Left popliteal fossa mass  Skin: Negative for color change.       Past Medical History:  Diagnosis Date  . Arthritis    hands  . GERD (gastroesophageal reflux disease)    RELATED TO GALLBLADDER  . Lung nodule    RECENTLY FOUND WHEN HAVING CT SCAN DONE ON 10-13-16 FOR GALLBLADDER ATTACK     Social History   Socioeconomic History  . Marital status: Single    Spouse name: Not on file  . Number of children: Not on file  . Years of education: Not on file  . Highest education level: Not on file  Occupational History  . Not on file  Social Needs  . Financial resource strain: Not on file  . Food insecurity:    Worry: Not on file    Inability: Not on file  . Transportation needs:    Medical: Not on file    Non-medical: Not on file  Tobacco Use  . Smoking status: Never Smoker  . Smokeless tobacco: Never Used  Substance and Sexual Activity  .  Alcohol use: Yes    Comment: occ  . Drug use: No  . Sexual activity: Not on file  Lifestyle  . Physical activity:    Days per week: Not on file    Minutes per session: Not on file  . Stress: Not on file  Relationships  . Social connections:    Talks on phone: Not on file    Gets together: Not on file    Attends religious service: Not on file    Active member of club or organization: Not on file    Attends meetings of clubs or organizations: Not on file    Relationship status: Not on file  . Intimate partner violence:    Fear of current or ex partner: Not on file    Emotionally abused: Not on file    Physically abused: Not on file    Forced sexual activity: Not on file  Other Topics Concern  . Not on file  Social History Narrative   Single.   2 children   Works as an Print production planneroffice manager   Enjoys spending time outdoors.    Past Surgical History:  Procedure Laterality Date  . ABDOMINAL HYSTERECTOMY    . CESAREAN SECTION     x2  . CHOLECYSTECTOMY N/A 11/03/2016   Procedure:  LAPAROSCOPIC CHOLECYSTECTOMY;  Surgeon: Lattie Haw, MD;  Location: [facility] ORS;  Service: General;  Laterality: N/A;    Family History  Problem Relation Age of Onset  . Lung cancer Father   . Arthritis Maternal Grandmother     Allergies  Allergen Reactions  . Penicillins Swelling, Rash and Itching    Rash and swelling of hands Has patient had a PCN reaction causing immediate rash, facial/tongue/throat swelling, SOB or lightheadedness with hypotension: Yes Has patient had a PCN reaction causing severe rash involving mucus membranes or skin necrosis: No Has patient had a PCN reaction that required hospitalization No Has patient had a PCN reaction occurring within the last 10 years: No If all of the above answers are "NO", then may proceed with Cephalosporin use.     Current Outpatient Medications on File Prior to Visit  Medication Sig Dispense Refill  . Multiple Vitamins-Minerals (MULTIVITAMIN  GUMMIES ADULT PO) Take by mouth.    Marland Kitchen VITAMIN B COMPLEX-C PO Take by mouth.     No current facility-administered medications on file prior to visit.     BP 122/80   Pulse 88   Temp 98.4 F (36.9 C) (Oral)   Ht 5\' 4"  (1.626 m)   Wt 164 lb (74.4 kg)   SpO2 97%   BMI 28.15 kg/m    Objective:   Physical Exam  Cardiovascular:  Pulses:      Dorsalis pedis pulses are 2+ on the right side and 2+ on the left side.       Posterior tibial pulses are 2+ on the right side and 2+ on the left side.  Left calf measuring 15.5 inches, right measuring 14 inches  Musculoskeletal:     Comments: Moderate swelling to left lower extremity distal to knee down to ankle. Popliteal mass noted that is non tender  Skin: Skin is warm and dry. No erythema.           Assessment & Plan:

## 2019-02-20 ENCOUNTER — Encounter: Payer: Self-pay | Admitting: Family Medicine

## 2019-02-20 ENCOUNTER — Ambulatory Visit: Payer: BLUE CROSS/BLUE SHIELD | Admitting: Family Medicine

## 2019-02-20 VITALS — BP 120/70 | HR 76 | Temp 98.4°F | Ht 64.0 in | Wt 162.8 lb

## 2019-02-20 DIAGNOSIS — M660 Rupture of popliteal cyst: Secondary | ICD-10-CM | POA: Diagnosis not present

## 2019-02-20 DIAGNOSIS — M25562 Pain in left knee: Secondary | ICD-10-CM

## 2019-02-20 MED ORDER — METHYLPREDNISOLONE ACETATE 40 MG/ML IJ SUSP
80.0000 mg | Freq: Once | INTRAMUSCULAR | Status: AC
  Administered 2019-02-20: 80 mg via INTRA_ARTICULAR

## 2019-02-20 NOTE — Progress Notes (Signed)
Erin Riffel T. Habeeb Puertas, MD Primary Care and Sports Medicine [facility] [address] [place], [address] Phone: [phone]   FAX: [phone]  Erin Weiss - 63 y.o. female   MRN [id number]   Date of Birth: 25-Sep-1956  Visit Date: 02/20/2019   PCP: Erin Nest, NP   Referred by: Erin Nest, NP  Chief Complaint  Patient presents with   Knee Pain    Left   Subjective:   Erin Weiss is a 63 y.o. very pleasant female patient who presents with the following:  F/u ruptured baker's cyst: She is a pleasant lady who saw Erin Weiss yesterday.  She has had some left lower extremity swelling over the last week or so, and there was some concern for potential DVT.  The patient had an lower extremity Doppler to rule out DVT, and this was negative for venous thromboembolism, but she did have a large collection of fluid consistent with a Baker's cyst rupture.  She is having some discomfort in that posterior knee, as well as in the lower extremity generally. The knee itself is not all that tender.   L knee.  Past Medical History, Surgical History, Social History, Family History, Problem List, Medications, and Allergies have been reviewed and updated if relevant.  Patient Active Problem List   Diagnosis Date Noted   Encounter for annual general medical examination with abnormal findings in adult 06/26/2017   Gastroesophageal reflux disease 06/26/2017   Hyperlipidemia 12/25/2016   Arthritis 12/25/2016   Pulmonary nodule 12/25/2016    Past Medical History:  Diagnosis Date   Arthritis    hands   GERD (gastroesophageal reflux disease)    RELATED TO GALLBLADDER   Lung nodule    RECENTLY FOUND WHEN HAVING CT SCAN DONE ON 10-13-16 FOR GALLBLADDER ATTACK    Past Surgical History:  Procedure Laterality Date   ABDOMINAL HYSTERECTOMY     CESAREAN SECTION     x2   CHOLECYSTECTOMY N/A 11/03/2016   Procedure: LAPAROSCOPIC  CHOLECYSTECTOMY;  Surgeon: Lattie Haw, MD;  Location: [facility] ORS;  Service: General;  Laterality: N/A;    Social History   Socioeconomic History   Marital status: Single    Spouse name: Not on file   Number of children: Not on file   Years of education: Not on file   Highest education level: Not on file  Occupational History   Not on file  Social Needs   Financial resource strain: Not on file   Food insecurity:    Worry: Not on file    Inability: Not on file   Transportation needs:    Medical: Not on file    Non-medical: Not on file  Tobacco Use   Smoking status: Never Smoker   Smokeless tobacco: Never Used  Substance and Sexual Activity   Alcohol use: Yes    Comment: occ   Drug use: No   Sexual activity: Not on file  Lifestyle   Physical activity:    Days per week: Not on file    Minutes per session: Not on file   Stress: Not on file  Relationships   Social connections:    Talks on phone: Not on file    Gets together: Not on file    Attends religious service: Not on file    Active member of club or organization: Not on file    Attends meetings of clubs or organizations: Not on file  Relationship status: Not on file   Intimate partner violence:    Fear of current or ex partner: Not on file    Emotionally abused: Not on file    Physically abused: Not on file    Forced sexual activity: Not on file  Other Topics Concern   Not on file  Social History Narrative   Single.   2 children   Works as an Print production planneroffice manager   Enjoys spending time outdoors.    Family History  Problem Relation Age of Onset   Lung cancer Father    Arthritis Maternal Grandmother     Allergies  Allergen Reactions   Penicillins Swelling, Rash and Itching    Rash and swelling of hands Has patient had a PCN reaction causing immediate rash, facial/tongue/throat swelling, SOB or lightheadedness with hypotension: Yes Has patient had a PCN reaction causing severe rash  involving mucus membranes or skin necrosis: No Has patient had a PCN reaction that required hospitalization No Has patient had a PCN reaction occurring within the last 10 years: No If all of the above answers are "NO", then may proceed with Cephalosporin use.     Medication [facility] and [facility].  GEN: No fevers, chills. Nontoxic. Primarily MSK c/o today. MSK: Detailed in the HPI GI: tolerating PO intake without difficulty Neuro: No numbness, parasthesias, or tingling associated. Otherwise the pertinent positives of the ROS are noted above.   Objective:   BP 120/70    Pulse 76    Temp 98.4 F (36.9 C) (Oral)    Ht 5\' 4"  (1.626 m)    Wt 162 lb 12 oz (73.8 kg)    BMI 27.94 kg/m    GEN: WDWN, NAD, Non-toxic, Alert & Oriented x 3 HEENT: Atraumatic, Normocephalic.  Ears and Nose: No external deformity. EXTR: No clubbing/cyanosis/edema NEURO: Normal gait.  PSYCH: Normally interactive. Conversant. Not depressed or anxious appearing.  Calm demeanor.    Left knee has full extension and flexion to 115 degrees.  There is mild to minimal effusion.  No significant tenderness with loading the medial or lateral patellar facets.  Joint line medially is mild to moderately tender, and the lateral joint line is significantly less tender.  Stable to varus and valgus stress.  ACL and PCL are intact.  Pes bursa is nontender.  All bony anatomy is nontender.  Flexion pinch test is mildly uncomfortable, and McMurray's test is negative.  She does have a prominent Baker's cyst in the popliteal fossa.  There is also some swelling compared to the right side and she does have some discomfort with squeezing her calf.  Radiology: [place] Venous Img Lower Unilateral Left  Result Date: 02/19/2019 CLINICAL DATA:  63 year old female with a history of pain and swelling left lower extremity EXAM: LEFT LOWER EXTREMITY VENOUS DOPPLER ULTRASOUND TECHNIQUE: Gray-scale sonography with  graded compression, as well as color Doppler and duplex ultrasound were performed to evaluate the lower extremity deep venous systems from the level of the common femoral vein and including the common femoral, femoral, profunda femoral, popliteal and calf veins including the posterior tibial, peroneal and gastrocnemius veins when visible. The superficial great saphenous vein was also interrogated. Spectral Doppler was utilized to evaluate flow at rest and with distal augmentation maneuvers in the common femoral, femoral and popliteal veins. COMPARISON:  None. FINDINGS: Contralateral Common Femoral Vein: Respiratory phasicity is normal and symmetric with the symptomatic side. No evidence of thrombus. Normal compressibility. Common Femoral Vein:  No evidence of thrombus. Normal compressibility, respiratory phasicity and response to augmentation. Saphenofemoral Junction: No evidence of thrombus. Normal compressibility and flow on color Doppler imaging. Profunda Femoral Vein: No evidence of thrombus. Normal compressibility and flow on color Doppler imaging. Femoral Vein: No evidence of thrombus. Normal compressibility, respiratory phasicity and response to augmentation. Popliteal Vein: No evidence of thrombus. Normal compressibility, respiratory phasicity and response to augmentation. Calf Veins: No evidence of thrombus. Normal compressibility and flow on color Doppler imaging. Superficial Great Saphenous Vein: No evidence of thrombus. Normal compressibility and flow on color Doppler imaging. Other Findings: Lentiform fluid collection in the left popliteal region with extension into the calf. The primary cyst measures 8.1 cm x 2.4 cm x 4.3 cm. Some internal complexity/septations. IMPRESSION: Sonographic survey of the left lower extremity negative for DVT. Left-sided Baker's cyst with questionable rupture into the calf tissues. Electronically Signed   By: Gilmer Mor D.O.   On: 02/19/2019 14:16    Assessment and  Plan:   Ruptured Bakers cyst - Plan: methylPREDNISolone acetate (DEPO-MEDROL) injection 80 mg  Acute pain of left knee  Pleasant lady with a fairly large ruptured Baker's cyst.  This is 8 cm x 2 cm x 4 cm.  This should be what is causing the patient's pain and swelling in her left lower extremity.  This typically is congruent with the intra-articular space, so I am getting give her an intra-articular injection with corticosteroid.  I am also going to recommend that the patient get some light compression like some athletic socks to pull up to the knee.  If her pain is recalcitrant, I could always drain the fluid collection itself, but this is less perfect from a side effect profile.  Aspiration/Injection Procedure Note KIMBERLEY GUMMO 1956/02/14 Date of procedure: 02/20/2019  Procedure: Large Joint Aspiration / Injection of Knee, L Indications: Pain  Procedure Details Patient verbally consented to procedure. Risks (including potential rare risk of infection), benefits, and alternatives explained. Sterilely prepped with Chloraprep. Ethyl cholride used for anesthesia. 8 cc Lidocaine 1% mixed with 2 mL Depo-Medrol 40 mg injected using the anteromedial approach without difficulty. No complications with procedure and tolerated well. Patient had decreased pain post-injection. Medication: 2 mL of Depo-Medrol 40 mg, equaling Depo-Medrol 80 mg total   Follow-up: prn only  Meds ordered this encounter  Medications   methylPREDNISolone acetate (DEPO-MEDROL) injection 80 mg   Signed,  Ruthel Martine T. Blease Capaldi, MD   Outpatient Encounter Medications as of 02/20/2019  Medication Sig   Multiple Vitamins-Minerals (MULTIVITAMIN GUMMIES ADULT PO) Take by mouth.   VITAMIN B COMPLEX-C PO Take by mouth.   [EXPIRED] methylPREDNISolone acetate (DEPO-MEDROL) injection 80 mg    No facility-administered encounter medications on file as of 02/20/2019.

## 2019-02-21 ENCOUNTER — Encounter: Payer: Self-pay | Admitting: Family Medicine

## 2019-03-31 DIAGNOSIS — Z6827 Body mass index (BMI) 27.0-27.9, adult: Secondary | ICD-10-CM | POA: Diagnosis not present

## 2019-03-31 DIAGNOSIS — Z01419 Encounter for gynecological examination (general) (routine) without abnormal findings: Secondary | ICD-10-CM | POA: Diagnosis not present

## 2019-03-31 DIAGNOSIS — Z1231 Encounter for screening mammogram for malignant neoplasm of breast: Secondary | ICD-10-CM | POA: Diagnosis not present

## 2019-04-01 ENCOUNTER — Other Ambulatory Visit: Payer: Self-pay | Admitting: Obstetrics & Gynecology

## 2019-04-01 DIAGNOSIS — R928 Other abnormal and inconclusive findings on diagnostic imaging of breast: Secondary | ICD-10-CM

## 2019-04-02 ENCOUNTER — Ambulatory Visit
Admission: RE | Admit: 2019-04-02 | Discharge: 2019-04-02 | Disposition: A | Discharge status: Home / Self Care | Payer: BLUE CROSS/BLUE SHIELD | Source: Ambulatory Visit | Attending: Obstetrics & Gynecology | Admitting: Obstetrics & Gynecology

## 2019-04-02 ENCOUNTER — Ambulatory Visit: Payer: BLUE CROSS/BLUE SHIELD

## 2019-04-02 ENCOUNTER — Other Ambulatory Visit: Payer: Self-pay

## 2019-04-02 DIAGNOSIS — R928 Other abnormal and inconclusive findings on diagnostic imaging of breast: Secondary | ICD-10-CM | POA: Diagnosis not present

## 2019-04-28 DIAGNOSIS — Z713 Dietary counseling and surveillance: Secondary | ICD-10-CM | POA: Diagnosis not present

## 2019-04-28 DIAGNOSIS — Z6827 Body mass index (BMI) 27.0-27.9, adult: Secondary | ICD-10-CM | POA: Diagnosis not present

## 2019-05-26 DIAGNOSIS — Z713 Dietary counseling and surveillance: Secondary | ICD-10-CM | POA: Diagnosis not present

## 2019-07-25 DIAGNOSIS — Z23 Encounter for immunization: Secondary | ICD-10-CM | POA: Diagnosis not present

## 2020-02-05 ENCOUNTER — Ambulatory Visit: Payer: BC Managed Care – PPO | Admitting: Primary Care

## 2020-02-05 ENCOUNTER — Other Ambulatory Visit: Payer: Self-pay

## 2020-02-05 ENCOUNTER — Encounter: Payer: Self-pay | Admitting: Primary Care

## 2020-02-05 VITALS — BP 122/72 | HR 75 | Temp 96.8°F | Ht 64.0 in | Wt 168.4 lb

## 2020-02-05 DIAGNOSIS — M25561 Pain in right knee: Secondary | ICD-10-CM | POA: Diagnosis not present

## 2020-02-05 MED ORDER — MELOXICAM 15 MG PO TABS: 15.0000 mg | ORAL_TABLET | Freq: Every day | ORAL | 0 refills | Status: DC

## 2020-02-05 MED ORDER — DICLOFENAC SODIUM 1 % EX GEL: 2.0000 g | Freq: Three times a day (TID) | CUTANEOUS | 0 refills | Status: DC | PRN

## 2020-02-05 NOTE — Assessment & Plan Note (Signed)
Present for 5 weeks, significant improvement after two doses of Meloxicam recently.  Suspect strain due to over activity. Exam today without alarm signs.   Rx for Meloxicam 15 mg sent to pharmacy, discussed to take just once daily. Rx for diclofenac gel sent to pharmacy. Use Tylenol if needed.  Discussed to gradually ease back into exercise. She will see Dr. Patsy Lager if no improvement after 1-2 weeks.

## 2020-02-05 NOTE — Patient Instructions (Signed)
You may take Meloxicam 15 mg once daily as needed for pain.  You may apply the diclofenac gel three times daily as needed for pain.  Supplement with Tylenol in between if needed. Do not exceed 3000 mg in 24 hours.  Gradually ease back into exercise.  It was a pleasure to see you today!

## 2020-02-05 NOTE — Progress Notes (Signed)
Subjective:    Patient ID: Erin Weiss, female    DOB: 03-18-1956, 64 y.o.   MRN: [id number]  HPI  This visit occurred during the SARS-CoV-2 public health emergency.  Safety protocols were in place, including screening questions prior to the visit, additional usage of staff PPE, and extensive cleaning of exam room while observing appropriate contact time as indicated for disinfecting solutions.   Erin Weiss is a very pleasant 64 year old female with a history of arthritis, GERD, hyperlipidemia who presents today with a chief complaint of knee pain.   Her pain is located to the right medial knee, distal mid knee. She thinks she may have "over did it" two days prior to symptoms beginning. She was squatting while washing two cards and gardening all in one afternoon. Symptoms began five weeks ago, two days after the increased activity.  Applying pressure to the medial side of her knee when sleeping has caused her to wake. She took Meloxicam 15 mg BID two days ago with near resolve in pain that evening and through yesterday afternoon. She's unable to do her aerobic walking due to her pain.  She denies numbness/tingling, swelling, erythema, and weakness to her left lower extremity. She's taken Tylenol alone without improvement.   Review of Systems  Musculoskeletal: Positive for arthralgias. Negative for joint swelling.  Skin: Negative for color change.  Neurological: Negative for weakness.       Past Medical History:  Diagnosis Date  . Arthritis    hands  . GERD (gastroesophageal reflux disease)    RELATED TO GALLBLADDER  . Lung nodule    RECENTLY FOUND WHEN HAVING CT SCAN DONE ON 10-13-16 FOR GALLBLADDER ATTACK     Social History   Socioeconomic History  . Marital status: Single    Spouse name: Not on file  . Number of children: Not on file  . Years of education: Not on file  . Highest education level: Not on file  Occupational History  . Not on file  Tobacco Use  .  Smoking status: Never Smoker  . Smokeless tobacco: Never Used  Substance and Sexual Activity  . Alcohol use: Yes    Comment: occ  . Drug use: No  . Sexual activity: Not on file  Other Topics Concern  . Not on file  Social History Narrative   Single.   2 children   Works as an Glass blower/designer   Enjoys spending time outdoors.   Social Determinants of Health   Financial Resource Strain:   . Difficulty of Paying Living Expenses:   Food Insecurity:   . Worried About Charity fundraiser in the Last Year:   . Arboriculturist in the Last Year:   Transportation Needs:   . Film/video editor (Medical):   Marland Kitchen Lack of Transportation (Non-Medical):   Physical Activity:   . Days of Exercise per Week:   . Minutes of Exercise per Session:   Stress:   . Feeling of Stress :   Social Connections:   . Frequency of Communication with Friends and Family:   . Frequency of Social Gatherings with Friends and Family:   . Attends Religious Services:   . Active Member of Clubs or Organizations:   . Attends Archivist Meetings:   Marland Kitchen Marital Status:   Intimate Partner Violence:   . Fear of Current or Ex-Partner:   . Emotionally Abused:   Marland Kitchen Physically Abused:   . Sexually Abused:  Past Surgical History:  Procedure Laterality Date  . ABDOMINAL HYSTERECTOMY    . CESAREAN SECTION     x2  . CHOLECYSTECTOMY N/A 11/03/2016   Procedure: LAPAROSCOPIC CHOLECYSTECTOMY;  Surgeon: Lattie Haw, MD;  Location: [facility] ORS;  Service: General;  Laterality: N/A;    Family History  Problem Relation Age of Onset  . Lung cancer Father   . Arthritis Maternal Grandmother     Allergies  Allergen Reactions  . Penicillins Swelling, Rash and Itching    Rash and swelling of hands Has patient had a PCN reaction causing immediate rash, facial/tongue/throat swelling, SOB or lightheadedness with hypotension: Yes Has patient had a PCN reaction causing severe rash involving mucus membranes or skin  necrosis: No Has patient had a PCN reaction that required hospitalization No Has patient had a PCN reaction occurring within the last 10 years: No If all of the above answers are "NO", then may proceed with Cephalosporin use.     Current Outpatient Medications on File Prior to Visit  Medication Sig Dispense Refill  . Multiple Vitamins-Minerals (MULTIVITAMIN GUMMIES ADULT PO) Take by mouth.    Marland Kitchen VITAMIN B COMPLEX-C PO Take by mouth.     No current facility-administered medications on file prior to visit.    BP 122/72   Pulse 75   Temp (!) 96.8 F (36 C) (Temporal)   Ht 5\' 4"  (1.626 m)   Wt 168 lb 6 oz (76.4 kg)   SpO2 98%   BMI 28.90 kg/m    Objective:   Physical Exam  Constitutional: She appears well-nourished.  Musculoskeletal:     Right knee: No swelling, deformity, erythema or bony tenderness. Normal range of motion. Tenderness present over the lateral joint line. No LCL laxity or MCL laxity.  Skin: Skin is warm and dry. No erythema.           Assessment & Plan:

## 2020-02-14 ENCOUNTER — Ambulatory Visit: Payer: BC Managed Care – PPO | Attending: Internal Medicine

## 2020-02-14 DIAGNOSIS — Z23 Encounter for immunization: Secondary | ICD-10-CM

## 2020-02-14 NOTE — Progress Notes (Signed)
   Covid-19 Vaccination Clinic  Name:  AUSTINA CONSTANTIN    MRN: [id number] DOB: September 02, 1956  02/14/2020  Ms. Slager was observed post Covid-19 immunization for 15 minutes without incident. She was provided with Vaccine Information Sheet and instruction to access the V-Safe system.   Ms. Rinkenberger was instructed to call 911 with any severe reactions post vaccine: Marland Kitchen Difficulty breathing  . Swelling of face and throat  . A fast heartbeat  . A bad rash all over body  . Dizziness and weakness   Immunizations Administered    Name Date Dose VIS Date Route   Pfizer COVID-19 Vaccine 02/14/2020  9:52 AM 0.3 mL 10/10/2019 Intramuscular   Manufacturer: [employer], [employer]   Lot: [id number]   NDC: [address]-7

## 2020-02-24 NOTE — Progress Notes (Signed)
Domonick Sittner T. Layce Sprung, MD, [place]  Primary Care and Sports Medicine [facility] at [facility] [place], [address]  Phone: [phone]  FAX: [phone]  Erin Weiss - 64 y.o. female  MRN [id number]  Date of Birth: 16-Feb-1956  Date: 02/25/2020  PCP: Pleas Koch, NP  Referral: Pleas Koch, NP  Chief Complaint  Patient presents with  . Knee Pain    Right x 12 weeks    This visit occurred during the SARS-CoV-2 public health emergency.  Safety protocols were in place, including screening questions prior to the visit, additional usage of staff PPE, and extensive cleaning of exam room while observing appropriate contact time as indicated for disinfecting solutions.   Subjective:   Erin Weiss is a 64 y.o. very pleasant female patient with Body mass index is 29.22 kg/m. who presents with the following:  She is a pleasant 64 year old young lady who presents with some acute right knee pain.  She saw my partner [name] several weeks ago with some acute knee pain.  I evaluated her myself with a ruptured Baker's cyst approximately 1 year ago.  She was outside doing quite a bit of work on the yard and she developed some pain distal to the knee joint and medial.  This is been persistent.  She has tried some topical Voltaren as well as meloxicam and ice as well as Tylenol.  She is here today for further evaluation.  R knee, does not know what happended.  Has been very active.  Two to three miles of walking.  Did more yardwork.  Squatted to do the tires.  Two days later, had a lot of medial tenderness.  At this point the primary issue is the burning and tingling pain she has on the anteromedial aspect of the knee.  She is not have any deep pain with deep flexion or extension.  Not significant pain with rotational movements.  Saw [name], mobic and nsaids.    64 yo, rode horses all the time.  Was going to the barn and  jumped off - then fine.  Will feel pull with aerobics.   Review of Systems is noted in the HPI, as appropriate   Objective:   BP 120/80   Pulse 78   Temp 97.9 F (36.6 C) (Temporal)   Ht 5\' 4"  (1.626 m)   Wt 170 lb 4 oz (77.2 kg)   SpO2 98%   BMI 29.22 kg/m    GEN: No acute distress; alert,appropriate. PULM: Breathing comfortably in no respiratory distress PSYCH: Normally interactive.   Knee:  r Gait: Normal heel toe pattern ROM: 0-130 Effusion: neg Echymosis or edema: none Patellar tendon NT Painful PLICA: neg Patellar grind: negative Medial and lateral patellar facet loading: negative medial and lateral joint lines: Mild medial joint line tenderness Mcmurray's neg Flexion-pinch neg Varus and valgus stress: stable Lachman: neg Ant and Post drawer: neg Hip abduction, IR, ER: WNL Hip flexion str: 5/5 Hip abd: 5/5 Quad: 5/5 VMO atrophy:No Hamstring concentric and eccentric: 5/5   Radiology: No results found.   Assessment and Plan:     ICD-10-CM   1. Inflammation of peripheral nerve  G62.9   2. Acute pain of right knee  M25.561    This point I think that this is all topical nerve irritation causing her burning and tingling.  I do not think she has much significant intra-articular pathology.  Start with neuropathic pain medications.  Elavil for now, and if she has no success with TCAs then change to gabapentin or Lyrica is an appropriate next step.  Hopefully capsaicin will help to decrease the burning sensation as well.  This generally works well if the heat as tolerated.  Follow-up: 4 to 6 weeks  Meds ordered this encounter  Medications  . amitriptyline (ELAVIL) 25 MG tablet    Sig: Take 1 tablet (25 mg total) by mouth at bedtime.    Dispense:  30 tablet    Refill:  3   There are no discontinued medications. No orders of the defined types were placed in this encounter.   Signed,  Elpidio Galea. Maui Ahart, MD   Outpatient Encounter Medications as of  02/25/2020  Medication Sig  . diclofenac Sodium (VOLTAREN) 1 % GEL Apply 2 g topically 3 (three) times daily as needed (pain).  . meloxicam (MOBIC) 15 MG tablet Take 1 tablet (15 mg total) by mouth daily. As needed for pain.  . Multiple Vitamins-Minerals (MULTIVITAMIN GUMMIES ADULT PO) Take by mouth.  Marland Kitchen VITAMIN B COMPLEX-C PO Take by mouth.  Marland Kitchen amitriptyline (ELAVIL) 25 MG tablet Take 1 tablet (25 mg total) by mouth at bedtime.   No facility-administered encounter medications on file as of 02/25/2020.

## 2020-02-25 ENCOUNTER — Encounter: Payer: Self-pay | Admitting: Family Medicine

## 2020-02-25 ENCOUNTER — Ambulatory Visit: Payer: BC Managed Care – PPO | Admitting: Family Medicine

## 2020-02-25 ENCOUNTER — Other Ambulatory Visit: Payer: Self-pay

## 2020-02-25 VITALS — BP 120/80 | HR 78 | Temp 97.9°F | Ht 64.0 in | Wt 170.2 lb

## 2020-02-25 DIAGNOSIS — G629 Polyneuropathy, unspecified: Secondary | ICD-10-CM

## 2020-02-25 DIAGNOSIS — M25561 Pain in right knee: Secondary | ICD-10-CM

## 2020-02-25 MED ORDER — AMITRIPTYLINE HCL 25 MG PO TABS: 25.0000 mg | ORAL_TABLET | Freq: Every day | ORAL | 3 refills | Status: DC

## 2020-02-25 NOTE — Patient Instructions (Addendum)
For the burning:  Topical Capzaicin Cream, as needed (wear glove to put on) - THIS IS EXCEPTIONALLY HOT

## 2020-02-28 ENCOUNTER — Other Ambulatory Visit: Payer: Self-pay | Admitting: Primary Care

## 2020-02-28 DIAGNOSIS — M25561 Pain in right knee: Secondary | ICD-10-CM

## 2020-02-28 NOTE — Telephone Encounter (Signed)
Last filled on 02/05/20 and that was when this medication was started and filled for #30 with 0 refill. LOV with Dr Patsy Lager 02/25/20. Next appointment on 03/24/20 with Dr Patsy Lager

## 2020-03-01 NOTE — Telephone Encounter (Signed)
Noted, refill sent to pharmacy. 

## 2020-03-09 ENCOUNTER — Ambulatory Visit: Payer: BC Managed Care – PPO | Attending: Internal Medicine

## 2020-03-09 DIAGNOSIS — Z23 Encounter for immunization: Secondary | ICD-10-CM

## 2020-03-09 NOTE — Progress Notes (Signed)
   Covid-19 Vaccination Clinic  Name:  Erin Weiss    MRN: [id number] DOB: [birth year]  03/09/2020  Ms. [name] was observed post Covid-19 immunization for 15 minutes without incident. She was provided with Vaccine Information Sheet and instruction to access the V-Safe system.   Ms. Backs was instructed to call 911 with any severe reactions post vaccine: Marland Kitchen Difficulty breathing  . Swelling of face and throat  . A fast heartbeat  . A bad rash all over body  . Dizziness and weakness   Immunizations Administered    Name Date Dose VIS Date Route   Pfizer COVID-19 Vaccine 03/09/2020  4:29 PM 0.3 mL 12/24/2018 Intramuscular   Manufacturer: [employer], [employer]   Lot: [id number]   NDC: [address]-4

## 2020-03-18 ENCOUNTER — Other Ambulatory Visit: Payer: Self-pay | Admitting: Family Medicine

## 2020-03-23 NOTE — Progress Notes (Signed)
Erin Driskill T. Shekira Drummer, MD, [place]  Primary Care and Sports Medicine [facility] at [facility] [place], [address]  Phone: [phone]  FAX: [phone]  Erin Weiss - 64 y.o. female  MRN [id number]  Date of Birth: May 25, 1956  Date: 03/24/2020  PCP: Pleas Koch, NP  Referral: Pleas Koch, NP  Chief Complaint  Patient presents with  . Follow-up    Right Knee Pain    This visit occurred during the SARS-CoV-2 public health emergency.  Safety protocols were in place, including screening questions prior to the visit, additional usage of staff PPE, and extensive cleaning of exam room while observing appropriate contact time as indicated for disinfecting solutions.   Subjective:   Erin Weiss is a 64 y.o. very pleasant female patient with Body mass index is 29.61 kg/m. who presents with the following:  She is here in follow-up of her acute knee pain.  At the time of my initial evaluation she had some pain distal to the knee joint and some pain medially.  Her primary issue at that time was some burning and tingling at the anteromedial aspect of the knee distal to the joint line.  A little bit better, bt has some days up and down. Yesterday came back some.  R medial joint line.  The burning that she was experience, was the worst issue as of the last time I examined her, but today this is entirely resolved.  She is stopped taking her meloxicam as well as her Elavil.  Currently she is having pain at the medial joint line, and this is the primary complaint today.  Asp r knee 25 cc  03/18/2020 Last OV with [name], MD  She is a pleasant 64 year old young lady who presents with some acute right knee pain.  She saw my partner [name] several weeks ago with some acute knee pain.  I evaluated her myself with a ruptured Baker's cyst approximately 1 year ago.  She was outside doing quite a bit of work on  the yard and she developed some pain distal to the knee joint and medial.  This is been persistent.  She has tried some topical Voltaren as well as meloxicam and ice as well as Tylenol.  She is here today for further evaluation.  R knee, does not know what happended.  Has been very active.  Two to three miles of walking.  Did more yardwork.  Squatted to do the tires.  Two days later, had a lot of medial tenderness.  At this point the primary issue is the burning and tingling pain she has on the anteromedial aspect of the knee.  She is not have any deep pain with deep flexion or extension.  Not significant pain with rotational movements.  Saw [name], mobic and nsaids.    64 yo, rode horses all the time.  Was going to the barn and jumped off - then fine.  Will feel pull with aerobics.   Review of Systems is noted in the HPI, as appropriate   Objective:   BP 130/80   Pulse 90   Temp (!) 97.4 F (36.3 C) (Temporal)   Ht 5\' 4"  (1.626 m)   Wt 172 lb 8 oz (78.2 kg)   SpO2 97%   BMI 29.61 kg/m    GEN: No acute distress; alert,appropriate. PULM: Breathing comfortably in no respiratory distress PSYCH: Normally interactive.    Right knee: Moderate sized  effusion.  Stable to varus and valgus stress.  ACL and PCL are intact.  She does have some medial joint line tenderness.  Lateral joint line is not tender.  She has some modest tenderness with flexion pinch as well as McMurray's without mechanical symptoms.  Bounce home is normal.  Nontender and the remainder of the bony anatomy as well as tendons.  Radiology: No results found.  Assessment and Plan:     ICD-10-CM   1. Acute pain of right knee  M25.561 DG Knee 4 Views W/Patella Right    methylPREDNISolone acetate (DEPO-MEDROL) injection 80 mg  2. Inflammation of peripheral nerve  G62.9    Weightbearing knee films show preserved joint spaces without fracture or dislocation.  Minimal narrowing is appreciated in the [place] view.  Electronically Signed  By: Hannah Beat, MD On: 03/24/2020  8:40 AM EDT   Internal derangement in the setting with no osteoarthritis at age 3 with a acute onset at 3 months ago.  Consistent with degenerative meniscal tear.  Reviewed conservative pathways.  Today I will aspirate her knee, inject with steroids, and follow-up in 6 weeks.  If she is better, there is no need for her to follow-up with me.  Thankfully her peripheral nerve inflammation is resolved at this point.  Aspiration/Injection Procedure Note [name] 1956/01/13 Date of procedure: 03/24/2020  Procedure: Large Joint Aspiration / Injection with synovial fluid aspiration of knee, right Indications: Pain  Procedure Details Patient verbally consented; risks, benefits, and alternatives explained including possible infection. Patient prepped with Chloraprep. Ethyl chloride for anesthesia. 10 cc of 1% Lidocaine used in wheal then injected Subcutaneous fashion with 22 gauge needle on lateral approach. Under sterilne conditions, 18 gauge needle used via lateral approach to aspirate 25 cc of serosanguineous fluid. Then 8 cc of Lidocaine 1% and 2 mL of Depo-Medrol 40 mg injected. Tolerated well, decreased pain, no complications. Medication: 2 mL of Depo-Medrol 40 mg, equaling Depo-Medrol 80 mg total   Follow-up: No follow-ups on file.  Meds ordered this encounter  Medications  . methylPREDNISolone acetate (DEPO-MEDROL) injection 80 mg   Medications Discontinued During This Encounter  Medication Reason  . meloxicam (MOBIC) 15 MG tablet Completed Course  . amitriptyline (ELAVIL) 25 MG tablet Side effect (s)   Orders Placed This Encounter  Procedures  . DG Knee 4 Views W/Patella Right    Signed,  [name] T. Maryjane Benedict, MD   Outpatient Encounter Medications as of 03/24/2020  Medication Sig  . diclofenac Sodium (VOLTAREN) 1 % GEL Apply 2 g topically 3 (three) times daily as needed (pain).  . Multiple Vitamins-Minerals  (MULTIVITAMIN GUMMIES ADULT PO) Take by mouth.  Marland Kitchen VITAMIN B COMPLEX-C PO Take by mouth.  . [DISCONTINUED] amitriptyline (ELAVIL) 25 MG tablet TAKE 1 TABLET BY MOUTH EVERYDAY AT BEDTIME  . [DISCONTINUED] meloxicam (MOBIC) 15 MG tablet TAKE 1 TABLET (15 MG TOTAL) BY MOUTH DAILY. AS NEEDED FOR PAIN.  . [EXPIRED] methylPREDNISolone acetate (DEPO-MEDROL) injection 80 mg    No facility-administered encounter medications on file as of 03/24/2020.

## 2020-03-24 ENCOUNTER — Ambulatory Visit: Payer: BC Managed Care – PPO | Admitting: Family Medicine

## 2020-03-24 ENCOUNTER — Encounter: Payer: Self-pay | Admitting: Family Medicine

## 2020-03-24 ENCOUNTER — Ambulatory Visit (INDEPENDENT_AMBULATORY_CARE_PROVIDER_SITE_OTHER)
Admission: RE | Admit: 2020-03-24 | Discharge: 2020-03-24 | Disposition: A | Discharge status: Home / Self Care | Payer: BC Managed Care – PPO | Source: Ambulatory Visit | Attending: Family Medicine | Admitting: Family Medicine

## 2020-03-24 ENCOUNTER — Other Ambulatory Visit: Payer: Self-pay

## 2020-03-24 VITALS — BP 130/80 | HR 90 | Temp 97.4°F | Ht 64.0 in | Wt 172.5 lb

## 2020-03-24 DIAGNOSIS — G629 Polyneuropathy, unspecified: Secondary | ICD-10-CM

## 2020-03-24 DIAGNOSIS — M25561 Pain in right knee: Secondary | ICD-10-CM | POA: Diagnosis not present

## 2020-03-24 MED ORDER — METHYLPREDNISOLONE ACETATE 40 MG/ML IJ SUSP
80.0000 mg | Freq: Once | INTRAMUSCULAR | Status: AC
  Administered 2020-03-24: 80 mg via INTRA_ARTICULAR

## 2020-04-27 DIAGNOSIS — Z6829 Body mass index (BMI) 29.0-29.9, adult: Secondary | ICD-10-CM | POA: Diagnosis not present

## 2020-04-27 DIAGNOSIS — Z1231 Encounter for screening mammogram for malignant neoplasm of breast: Secondary | ICD-10-CM | POA: Diagnosis not present

## 2020-04-27 DIAGNOSIS — Z01419 Encounter for gynecological examination (general) (routine) without abnormal findings: Secondary | ICD-10-CM | POA: Diagnosis not present

## 2020-05-04 ENCOUNTER — Other Ambulatory Visit: Payer: Self-pay | Admitting: Obstetrics & Gynecology

## 2020-05-04 DIAGNOSIS — R928 Other abnormal and inconclusive findings on diagnostic imaging of breast: Secondary | ICD-10-CM

## 2020-05-17 ENCOUNTER — Ambulatory Visit
Admission: RE | Admit: 2020-05-17 | Discharge: 2020-05-17 | Disposition: A | Discharge status: Home / Self Care | Payer: BC Managed Care – PPO | Source: Ambulatory Visit | Attending: Obstetrics & Gynecology | Admitting: Obstetrics & Gynecology

## 2020-05-17 ENCOUNTER — Other Ambulatory Visit: Payer: Self-pay

## 2020-05-17 DIAGNOSIS — R928 Other abnormal and inconclusive findings on diagnostic imaging of breast: Secondary | ICD-10-CM

## 2020-05-17 DIAGNOSIS — N6489 Other specified disorders of breast: Secondary | ICD-10-CM | POA: Diagnosis not present

## 2020-05-19 DIAGNOSIS — Z13820 Encounter for screening for osteoporosis: Secondary | ICD-10-CM | POA: Diagnosis not present

## 2020-08-11 DIAGNOSIS — Z23 Encounter for immunization: Secondary | ICD-10-CM | POA: Diagnosis not present

## 2021-07-05 DIAGNOSIS — Z1231 Encounter for screening mammogram for malignant neoplasm of breast: Secondary | ICD-10-CM | POA: Diagnosis not present

## 2021-07-05 DIAGNOSIS — Z01419 Encounter for gynecological examination (general) (routine) without abnormal findings: Secondary | ICD-10-CM | POA: Diagnosis not present

## 2021-07-05 DIAGNOSIS — Z6831 Body mass index (BMI) 31.0-31.9, adult: Secondary | ICD-10-CM | POA: Diagnosis not present

## 2021-09-23 DIAGNOSIS — Z23 Encounter for immunization: Secondary | ICD-10-CM | POA: Diagnosis not present

## 2022-03-28 ENCOUNTER — Encounter: Payer: Self-pay | Admitting: Family

## 2022-03-28 ENCOUNTER — Ambulatory Visit: Payer: BC Managed Care – PPO | Admitting: Family

## 2022-03-28 VITALS — BP 122/76 | HR 80 | Temp 98.7°F | Resp 16 | Ht 64.0 in | Wt 187.2 lb

## 2022-03-28 DIAGNOSIS — R739 Hyperglycemia, unspecified: Secondary | ICD-10-CM | POA: Diagnosis not present

## 2022-03-28 DIAGNOSIS — R911 Solitary pulmonary nodule: Secondary | ICD-10-CM | POA: Diagnosis not present

## 2022-03-28 DIAGNOSIS — R5383 Other fatigue: Secondary | ICD-10-CM

## 2022-03-28 DIAGNOSIS — E782 Mixed hyperlipidemia: Secondary | ICD-10-CM | POA: Diagnosis not present

## 2022-03-28 LAB — B12 AND FOLATE PANEL
Folate: >24.2 ng/mL (ref >5.9)
Vitamin B-12: 430 pg/mL (ref 211–911)

## 2022-03-28 LAB — CBC WITH DIFFERENTIAL/PLATELET
Basophils Absolute: 0 10*3/uL (ref 0.0–0.1)
Basophils Relative: 0.5 % (ref 0.0–3.0)
Eosinophils Absolute: 0.1 10*3/uL (ref 0.0–0.7)
Eosinophils Relative: 1.2 % (ref 0.0–5.0)
HCT: 41.1 % (ref 36.0–46.0)
Hemoglobin: 14 g/dL (ref 12.0–15.0)
Lymphocytes Relative: 22.9 % (ref 12.0–46.0)
Lymphs Abs: 1.6 10*3/uL (ref 0.7–4.0)
MCHC: 34 g/dL (ref 30.0–36.0)
MCV: 90.5 fl (ref 78.0–100.0)
Monocytes Absolute: 0.5 10*3/uL (ref 0.1–1.0)
Monocytes Relative: 7.1 % (ref 3.0–12.0)
Neutro Abs: 4.7 10*3/uL (ref 1.4–7.7)
Neutrophils Relative %: 68.3 % (ref 43.0–77.0)
Platelets: 206 10*3/uL (ref 150.0–400.0)
RBC: 4.54 Mil/uL (ref 3.87–5.11)
RDW: 12.5 % (ref 11.5–15.5)
WBC: 6.8 10*3/uL (ref 4.0–10.5)

## 2022-03-28 LAB — COMPREHENSIVE METABOLIC PANEL
ALT: 19 U/L (ref 0–35)
AST: 19 U/L (ref 0–37)
Albumin: 4.5 g/dL (ref 3.5–5.2)
Alkaline Phosphatase: 63 U/L (ref 39–117)
BUN: 11 mg/dL (ref 6–23)
CO2: 28 mEq/L (ref 19–32)
Calcium: 9.7 mg/dL (ref 8.4–10.5)
Chloride: 104 mEq/L (ref 96–112)
Creatinine, Ser: 0.78 mg/dL (ref 0.40–1.20)
GFR: 80.76 mL/min (ref >60.00)
Glucose, Bld: 97 mg/dL (ref 70–99)
Potassium: 4 mEq/L (ref 3.5–5.1)
Sodium: 140 mEq/L (ref 135–145)
Total Bilirubin: 0.9 mg/dL (ref 0.2–1.2)
Total Protein: 6.8 g/dL (ref 6.0–8.3)

## 2022-03-28 LAB — LIPID PANEL
Cholesterol: 248 mg/dL — ABNORMAL HIGH (ref 0–200)
HDL: 74.3 mg/dL (ref >39.00)
LDL Cholesterol: 137 mg/dL — ABNORMAL HIGH (ref 0–99)
NonHDL: 173.65
Total CHOL/HDL Ratio: 3
Triglycerides: 185 mg/dL — ABNORMAL HIGH (ref 0.0–149.0)
VLDL: 37 mg/dL (ref 0.0–40.0)

## 2022-03-28 LAB — HEMOGLOBIN A1C: Hgb A1c MFr Bld: 5.5 % (ref 4.6–6.5)

## 2022-03-28 LAB — TSH: TSH: 1.52 u[IU]/mL (ref 0.35–5.50)

## 2022-03-28 NOTE — Assessment & Plan Note (Signed)
Lab workup for fatigue If all negative may consider sleep study

## 2022-03-28 NOTE — Assessment & Plan Note (Signed)
Ordered lipid panel, pending results. Work on low cholesterol diet and exercise as tolerated ? ?

## 2022-03-28 NOTE — Assessment & Plan Note (Signed)
Reviewed Ct 2018, stable , no further imaging required per radiologist

## 2022-03-28 NOTE — Progress Notes (Signed)
Established Patient Office Visit  Subjective:  Patient ID: Erin Weiss, female    DOB: 1956-04-24  Age: 66 y.o. MRN: [id number]  CC:  Chief Complaint  Patient presents with   Fatigue    X 2 weeks Sleep like a brick but wakes up feeling tired.    HPI Erin Weiss is here today with concerns.  Over the last few months with dwindling energy, not normal for her. Worse in last two weeks, and especially in the last few days.   She feels if at any time she could lie down and get to sleep.  Took her two hours to dressed to go to grocery store yesterday so was her worst day yet. She does exercise every morning and walks an aerobic one mile, and then again at night.   She starts off ok in the am but by night time she is drained. Works for a [employer], always been stressful, no recent change in this.   Only change was she had an implant placed about 2.5 months ago by oral surgeon but asymptomatic for fever.  Doesn't feel depressed, but feels overly tired making her want to do nothing much more than sleeping.  Pt is not sure if she has snored through the night, and doesn't recall waking up at night due to gasping for breath, for any risk factors for sleep apnea. Pt is mildly overweight.      03/28/2022   12:07 PM  GAD 7 : Generalized Anxiety Score  Nervous, Anxious, on Edge 0  Control/stop worrying 1  Worry too much - different things 1  Trouble relaxing 1  Restless 1  Easily annoyed or irritable 1  Afraid - awful might happen 0  Total GAD 7 Score 5  Anxiety Difficulty Not difficult at all       03/28/2022   12:06 PM  PHQ9 SCORE ONLY  PHQ-9 Total Score 11    Past Medical History:  Diagnosis Date   Arthritis    hands   GERD (gastroesophageal reflux disease)    RELATED TO GALLBLADDER   Lung nodule    RECENTLY FOUND WHEN HAVING CT SCAN DONE ON 10-13-16 FOR GALLBLADDER ATTACK    Past Surgical History:  Procedure Laterality Date   ABDOMINAL HYSTERECTOMY      CESAREAN SECTION     x2   CHOLECYSTECTOMY N/A 11/03/2016   Procedure: LAPAROSCOPIC CHOLECYSTECTOMY;  Surgeon: Lattie Haw, MD;  Location: [facility] ORS;  Service: General;  Laterality: N/A;    Family History  Problem Relation Age of Onset   Lung cancer Father    Arthritis Maternal Grandmother     Social History   Socioeconomic History   Marital status: Single    Spouse name: Not on file   Number of children: Not on file   Years of education: Not on file   Highest education level: Not on file  Occupational History   Not on file  Tobacco Use   Smoking status: Never   Smokeless tobacco: Never  Substance and Sexual Activity   Alcohol use: Yes    Comment: occ   Drug use: No   Sexual activity: Not on file  Other Topics Concern   Not on file  Social History Narrative   Single.   2 children   Works as an Print production planner   Enjoys spending time outdoors.   Social Determinants of Health   Financial Resource Strain: Not on file  Food Insecurity: Not on file  Transportation Needs: Not on file  Physical Activity: Not on file  Stress: Not on file  Social Connections: Not on file  Intimate Partner Violence: Not on file    Outpatient Medications Prior to Visit  Medication Sig Dispense Refill   Ascorbic Acid (VITAMIN C) 100 MG tablet Take 100 mg by mouth daily.     Multiple Vitamins-Minerals (MULTIVITAMIN GUMMIES ADULT PO) Take by mouth.     VITAMIN B COMPLEX-C PO Take by mouth.     diclofenac Sodium (VOLTAREN) 1 % GEL Apply 2 g topically 3 (three) times daily as needed (pain). (Patient not taking: Reported on 03/28/2022) 100 g 0   No facility-administered medications prior to visit.    Allergies  Allergen Reactions   Penicillins Swelling, Rash and Itching    Rash and swelling of hands Has patient had a PCN reaction causing immediate rash, facial/tongue/throat swelling, SOB or lightheadedness with hypotension: Yes Has patient had a PCN reaction causing severe rash  involving mucus membranes or skin necrosis: No Has patient had a PCN reaction that required hospitalization No Has patient had a PCN reaction occurring within the last 10 years: No If all of the above answers are "NO", then may proceed with Cephalosporin use.        Objective:    Physical Exam Constitutional:      General: She is not in acute distress.    Appearance: Normal appearance. She is obese. She is not ill-appearing, toxic-appearing or diaphoretic.  HENT:     Head: Normocephalic.  Cardiovascular:     Rate and Rhythm: Normal rate and regular rhythm.  Pulmonary:     Effort: Pulmonary effort is normal.     Breath sounds: Normal breath sounds.  Skin:    General: Skin is warm and dry.  Neurological:     General: No focal deficit present.     Mental Status: She is alert and oriented to person, place, and time.  Psychiatric:        Mood and Affect: Mood normal.        Behavior: Behavior normal.        Thought Content: Thought content normal.        Judgment: Judgment normal.    BP 122/76   Pulse 80   Temp 98.7 F (37.1 C)   Resp 16   Ht 5\' 4"  (1.626 m)   Wt 187 lb 3 oz (84.9 kg)   SpO2 98%   BMI 32.13 kg/m  Wt Readings from Last 3 Encounters:  03/28/22 187 lb 3 oz (84.9 kg)  03/24/20 172 lb 8 oz (78.2 kg)  02/25/20 170 lb 4 oz (77.2 kg)     Health Maintenance Due  Topic Date Due   HIV Screening  Never done   Hepatitis C Screening  Never done   Zoster Vaccines- Shingrix (1 of 2) Never done   MAMMOGRAM  08/16/2017   PAP SMEAR-Modifier  05/25/2019   COVID-19 Vaccine (4 - Booster for Pfizer series) 12/13/2020    There are no preventive care reminders to display for this patient.  No results found for: TSH Lab Results  Component Value Date   WBC 4.9 06/26/2017   HGB 13.7 06/26/2017   HCT 39.9 06/26/2017   MCV 91.0 06/26/2017   PLT 233.0 06/26/2017   Lab Results  Component Value Date   NA 141 06/26/2017   K 4.4 06/26/2017   CO2 32 06/26/2017    GLUCOSE 103 (H) 06/26/2017   BUN 13 06/26/2017  CREATININE 0.84 06/26/2017   BILITOT 0.6 06/26/2017   ALKPHOS 62 06/26/2017   AST 18 06/26/2017   ALT 17 06/26/2017   PROT 7.1 06/26/2017   ALBUMIN 4.3 06/26/2017   CALCIUM 10.0 06/26/2017   ANIONGAP 5 11/02/2016   GFR 73.81 06/26/2017   Lab Results  Component Value Date   HGBA1C 5.4 06/26/2017      Assessment & Plan:   Problem List Items Addressed This Visit       Respiratory   Pulmonary nodule    Reviewed Ct 2018, stable , no further imaging required per radiologist         Other   Hyperglycemia - Primary    Ordering a1c pending results       Relevant Orders   Hemoglobin A1c   Mixed hyperlipidemia    Ordered lipid panel, pending results. Work on low cholesterol diet and exercise as tolerated        Relevant Orders   Lipid panel   Other fatigue    Lab workup for fatigue If all negative may consider sleep study       Relevant Orders   Comprehensive metabolic panel   [id number] and Folate Panel   TSH   CBC with Differential/Platelet    No orders of the defined types were placed in this encounter.   Follow-up: Return in about 2 weeks (around 04/11/2022) for with Mayra ReelKate Clark her PCP .    Mort Sawyersabitha Renold Kozar, FNP

## 2022-03-28 NOTE — Patient Instructions (Signed)
Stop by the lab prior to leaving today. I will notify you of your results once received.   Due to recent changes in healthcare laws, you may see results of your imaging and/or laboratory studies on MyChart before I have had a chance to review them.  I understand that in some cases there may be results that are confusing or concerning to you. Please understand that not all results are received at the same time and often I may need to interpret multiple results in order to provide you with the best plan of care or course of treatment. Therefore, I ask that you please give me 2 business days to thoroughly review all your results before contacting my office for clarification. Should we see a critical lab result, you will be contacted sooner.   It was a pleasure seeing you today! Please do not hesitate to reach out with any questions and or concerns.  Regards,   Noell Shular FNP-C  

## 2022-03-28 NOTE — Assessment & Plan Note (Signed)
Ordering a1c pending results.  

## 2022-03-29 NOTE — Progress Notes (Signed)
Saw your pt for ongoing fatigue x 2 months getting worse. Labs were unremarkable for fatigue

## 2022-04-05 ENCOUNTER — Encounter: Payer: Self-pay | Admitting: Primary Care

## 2022-04-05 ENCOUNTER — Ambulatory Visit: Payer: BC Managed Care – PPO | Admitting: Primary Care

## 2022-04-05 VITALS — BP 120/70 | HR 83 | Temp 97.6°F | Ht 64.0 in | Wt 187.0 lb

## 2022-04-05 DIAGNOSIS — R5383 Other fatigue: Secondary | ICD-10-CM | POA: Diagnosis not present

## 2022-04-05 LAB — POC URINALSYSI DIPSTICK (AUTOMATED)
Bilirubin, UA: NEGATIVE
Blood, UA: NEGATIVE
Glucose, UA: NEGATIVE
Ketones, UA: NEGATIVE
Leukocytes, UA: NEGATIVE
Nitrite, UA: NEGATIVE
Protein, UA: NEGATIVE
Spec Grav, UA: 1.015 (ref 1.010–1.025)
Urobilinogen, UA: 0.2 E.U./dL
pH, UA: 6 (ref 5.0–8.0)

## 2022-04-05 LAB — LIPASE: Lipase: 60 U/L — ABNORMAL HIGH (ref 11.0–59.0)

## 2022-04-05 NOTE — Progress Notes (Signed)
Subjective:    Patient ID: Erin Weiss, female    DOB: 1956/04/08, 66 y.o.   MRN: [id number]  HPI  Erin Weiss is a very pleasant 66 y.o. female with a history of hyperglycemia, hyperlipidemia, fatigue, pulmonary nodule who presents today for follow-up of chronic conditions.  I have not seen patient since April 2021.  Evaluated by Stephannie Peters, NP on 03/28/2022 for a several month history of fatigue, lower energy that have progressed over the previous 2 weeks.  During this visit she underwent lab work which was grossly normal except for a normal/low vitamin B12 level.  She was advised to start 1000 mcg of vitamin B12 daily.  Today she's feeling well. Her symptoms are intermittent, doesn't occur daily. Her fatigue symptoms are preceded by nausea. Her nausea will improve with eating. There is no pattern to her nausea or fatigue symptoms. Her fatigue symptoms will last for a few hours. This is not her typical behavior as she is very active during the day. She just doesn't feel right, something has changed.   She does experience epigastric burning with reflux that is occurring twice weekly on average. She's recently been taking omeprazole 20 mg as needed for her symptoms. Her last dose was about 1 week ago.   She denies abdominal pain, constipation, diarrhea, bloody stools, unexplained weight loss, joint aches, tick bites, headaches, belching, depression, anxiety, urinary symptoms, changes in supplements or diet. She is taking vitamin B Complex for which she has been taking for years.   She knows that she does not snore, she asked her children. She wakes once during the night to urinate, chronic history of this. She denies anxiety/depression. She does experience daytime drowsiness if not stimulated. Last weekend she was able to fall asleep while sitting down.   Her last colonoscopy was 9 years ago, she is due next year. Mammogram is up to date, follows with GYN.  BP Readings from Last 3  Encounters:  04/05/22 120/70  03/28/22 122/76  03/24/20 130/80      Review of Systems  Constitutional:  Positive for fatigue. Negative for unexpected weight change.  Respiratory:  Negative for cough.   Gastrointestinal:  Positive for nausea. Negative for abdominal pain, blood in stool, diarrhea and vomiting.  Neurological:  Negative for dizziness and headaches.  Hematological:  Negative for adenopathy.  Psychiatric/Behavioral:  Negative for sleep disturbance. The patient is not nervous/anxious.         Past Medical History:  Diagnosis Date   Arthritis    hands   GERD (gastroesophageal reflux disease)    RELATED TO GALLBLADDER   Lung nodule    RECENTLY FOUND WHEN HAVING CT SCAN DONE ON 10-13-16 FOR GALLBLADDER ATTACK    Social History   Socioeconomic History   Marital status: Single    Spouse name: Not on file   Number of children: Not on file   Years of education: Not on file   Highest education level: Not on file  Occupational History   Not on file  Tobacco Use   Smoking status: Never   Smokeless tobacco: Never  Substance and Sexual Activity   Alcohol use: Yes    Comment: occ   Drug use: No   Sexual activity: Not on file  Other Topics Concern   Not on file  Social History Narrative   Single.   2 children   Works as an Print production planner   Enjoys spending time outdoors.   Social Determinants of Health  Financial Resource Strain: Not on file  Food Insecurity: Not on file  Transportation Needs: Not on file  Physical Activity: Not on file  Stress: Not on file  Social Connections: Not on file  Intimate Partner Violence: Not on file    Past Surgical History:  Procedure Laterality Date   ABDOMINAL HYSTERECTOMY     CESAREAN SECTION     x2   CHOLECYSTECTOMY N/A 11/03/2016   Procedure: LAPAROSCOPIC CHOLECYSTECTOMY;  Surgeon: Lattie Haw, MD;  Location: [facility] ORS;  Service: General;  Laterality: N/A;    Family History  Problem Relation Age of Onset    Lung cancer Father    Arthritis Maternal Grandmother     Allergies  Allergen Reactions   Penicillins Swelling, Rash and Itching    Rash and swelling of hands Has patient had a PCN reaction causing immediate rash, facial/tongue/throat swelling, SOB or lightheadedness with hypotension: Yes Has patient had a PCN reaction causing severe rash involving mucus membranes or skin necrosis: No Has patient had a PCN reaction that required hospitalization No Has patient had a PCN reaction occurring within the last 10 years: No If all of the above answers are "NO", then may proceed with Cephalosporin use.     Current Outpatient Medications on File Prior to Visit  Medication Sig Dispense Refill   Ascorbic Acid (VITAMIN C) 100 MG tablet Take 100 mg by mouth daily.     Multiple Vitamins-Minerals (MULTIVITAMIN GUMMIES ADULT PO) Take by mouth.     VITAMIN B COMPLEX-C PO Take by mouth.     No current facility-administered medications on file prior to visit.    BP 120/70 (BP Location: Left Arm, Patient Position: Sitting, Cuff Size: Normal)   Pulse 83   Temp 97.6 F (36.4 C) (Temporal)   Ht 5\' 4"  (1.626 m)   Wt 187 lb (84.8 kg)   SpO2 97%   BMI 32.10 kg/m  Objective:   Physical Exam Constitutional:      General: She is not in acute distress. Cardiovascular:     Rate and Rhythm: Normal rate and regular rhythm.  Pulmonary:     Effort: Pulmonary effort is normal.     Breath sounds: Normal breath sounds.  Abdominal:     General: Bowel sounds are normal.     Palpations: Abdomen is soft.     Tenderness: There is no abdominal tenderness.  Musculoskeletal:     Cervical back: Neck supple.  Skin:    General: Skin is warm and dry.  Neurological:     Mental Status: She is alert.  Psychiatric:        Mood and Affect: Mood normal.          Assessment & Plan:   Problem List Items Addressed This Visit       Other   Other fatigue - Primary    Unclear etiology at this point. Labs  grossly unremarkable from last visit.   Will check urinalysis.  Also checking lipase.  Lower suspicion for tick borne illness but will check for Lyme and Alpha Gal.  Low suspicion for H pylori as she has no pain.  Start omeprazole 20 mg daily for nausea and reflux.   Consider sleep study. Await results.        Relevant Orders   POCT Urinalysis Dipstick (Automated)   Lipase   B. burgdorfi antibodies by WB   Alpha-Gal Panel       , NP

## 2022-04-05 NOTE — Patient Instructions (Signed)
Start omeprazole 20 mg daily, take this everyday for at least 2 weeks.  Stop by the lab prior to leaving today. I will notify you of your results once received.   It was a pleasure to see you today!

## 2022-04-05 NOTE — Assessment & Plan Note (Addendum)
Unclear etiology at this point. Labs grossly unremarkable from last visit.   UA today negative Also checking lipase.  Lower suspicion for tick borne illness but will check for Lyme and Alpha Gal.  Low suspicion for H pylori as she has no pain.  Start omeprazole 20 mg daily for nausea and reflux.   Consider sleep study. Await results.

## 2022-04-11 LAB — B. BURGDORFI ANTIBODIES BY WB

## 2022-04-11 LAB — ALPHA-GAL PANEL
Allergen, Mutton, f88: <0.1 kU/L
Allergen, Pork, f26: <0.1 kU/L
Beef: <0.1 kU/L
CLASS: 0
CLASS: 0
Class: 0
GALACTOSE-ALPHA-1,3-GALACTOSE IGE*: <0.1 kU/L (ref <0.10)

## 2022-04-11 LAB — INTERPRETATION:

## 2023-03-27 ENCOUNTER — Ambulatory Visit: Payer: BC Managed Care – PPO | Admitting: Primary Care

## 2023-03-27 ENCOUNTER — Ambulatory Visit: Payer: BC Managed Care – PPO | Admitting: Family Medicine

## 2023-03-27 ENCOUNTER — Encounter: Payer: Self-pay | Admitting: Internal Medicine

## 2023-03-27 ENCOUNTER — Ambulatory Visit: Payer: BC Managed Care – PPO | Admitting: Internal Medicine

## 2023-03-27 ENCOUNTER — Telehealth: Payer: Self-pay | Admitting: Primary Care

## 2023-03-27 VITALS — BP 124/80 | HR 60 | Temp 97.6°F | Ht 64.0 in | Wt 193.0 lb

## 2023-03-27 DIAGNOSIS — R1030 Lower abdominal pain, unspecified: Secondary | ICD-10-CM | POA: Diagnosis not present

## 2023-03-27 LAB — POC URINALSYSI DIPSTICK (AUTOMATED)
Bilirubin, UA: NEGATIVE
Blood, UA: NEGATIVE
Glucose, UA: NEGATIVE
Ketones, UA: NEGATIVE
Leukocytes, UA: NEGATIVE
Nitrite, UA: NEGATIVE
Protein, UA: NEGATIVE
Spec Grav, UA: 1.01 (ref 1.010–1.025)
Urobilinogen, UA: 0.2 E.U./dL
pH, UA: 6 (ref 5.0–8.0)

## 2023-03-27 NOTE — Telephone Encounter (Signed)
Pt called requesting a appt with Clark for severe abdominal pain she's experiencing. Pt states the pain began yesterday, 5/27 & when the pain occurs, she can't stand up straight. Pt also stated at times, she feels like she is about to faint due to the pain being so severe. Scheduled pt with Chestine Spore today, 5/28 @ 3pm. Transferred call to [facility]. Call back # [phone]

## 2023-03-27 NOTE — Addendum Note (Signed)
Addended by: Eual Fines on: 03/27/2023 11:57 AM   Modules accepted: Orders

## 2023-03-27 NOTE — Progress Notes (Signed)
Subjective:    Patient ID: Erin Weiss, female    DOB: 1956-03-24, 67 y.o.   MRN: [id number]  HPI Here due to abdominal pain  Started with abdominal pain yesterday Had done extensive yard work the 2 days before Thought she might have pulled something "Spasm" in lower abdomen a couple of times (all the way across)--only 10 seconds or though Felt awful yesterday---wiped out/lethargic, etc No fever Didn't eat much yesterday  Woke today  Had small breakfast Got up and had to lean over due to the pain (again brief) Then went to bathroom---loose stool Has had some repeated spells ---on way to work or sitting at desk  Current Outpatient Medications on File Prior to Visit  Medication Sig Dispense Refill   Ascorbic Acid (VITAMIN C) 100 MG tablet Take 100 mg by mouth daily.     Multiple Vitamins-Minerals (MULTIVITAMIN GUMMIES ADULT PO) Take by mouth.     VITAMIN B COMPLEX-C PO Take by mouth.     No current facility-administered medications on file prior to visit.    Allergies  Allergen Reactions   Penicillins Swelling, Rash and Itching    Rash and swelling of hands Has patient had a PCN reaction causing immediate rash, facial/tongue/throat swelling, SOB or lightheadedness with hypotension: Yes Has patient had a PCN reaction causing severe rash involving mucus membranes or skin necrosis: No Has patient had a PCN reaction that required hospitalization No Has patient had a PCN reaction occurring within the last 10 years: No If all of the above answers are "NO", then may proceed with Cephalosporin use.     Past Medical History:  Diagnosis Date   Arthritis    hands   GERD (gastroesophageal reflux disease)    RELATED TO GALLBLADDER   Lung nodule    RECENTLY FOUND WHEN HAVING CT SCAN DONE ON 10-13-16 FOR GALLBLADDER ATTACK    Past Surgical History:  Procedure Laterality Date   ABDOMINAL HYSTERECTOMY     CESAREAN SECTION     x2   CHOLECYSTECTOMY N/A 11/03/2016   Procedure:  LAPAROSCOPIC CHOLECYSTECTOMY;  Surgeon: Lattie Haw, MD;  Location: [facility] ORS;  Service: General;  Laterality: N/A;    Family History  Problem Relation Age of Onset   Lung cancer Father    Arthritis Maternal Grandmother     Social History   Socioeconomic History   Marital status: Single    Spouse name: Not on file   Number of children: Not on file   Years of education: Not on file   Highest education level: Not on file  Occupational History   Not on file  Tobacco Use   Smoking status: Never   Smokeless tobacco: Never  Substance and Sexual Activity   Alcohol use: Yes    Comment: occ   Drug use: No   Sexual activity: Not on file  Other Topics Concern   Not on file  Social History Narrative   Single.   2 children   Works as an Print production planner   Enjoys spending time outdoors.   Social Determinants of Health   Financial Resource Strain: Not on file  Food Insecurity: Not on file  Transportation Needs: Not on file  Physical Activity: Not on file  Stress: Not on file  Social Connections: Not on file  Intimate Partner Violence: Not on file   Review of Systems No dysuria or hematuria Slight nausea--no vomiting No sore throat No cough or breathing issues    Objective:   Physical  Exam Constitutional:      Appearance: She is well-developed.  Cardiovascular:     Rate and Rhythm: Normal rate and regular rhythm.     Heart sounds: No murmur heard.    No gallop.  Pulmonary:     Effort: Pulmonary effort is normal.     Breath sounds: Normal breath sounds. No wheezing or rales.  Abdominal:     General: There is no distension.     Palpations: Abdomen is soft.     Tenderness: There is no guarding or rebound.     Comments: Tenderness over the entire lower abdomen but no specific tenderness suggestive of any intraabdominal pathology  Musculoskeletal:     Cervical back: Neck supple.     Right lower leg: No edema.     Left lower leg: No edema.  Lymphadenopathy:      Cervical: No cervical adenopathy.  Neurological:     Mental Status: She is alert.            Assessment & Plan:

## 2023-03-27 NOTE — Telephone Encounter (Signed)
Okay---will assess her in the office

## 2023-03-27 NOTE — Telephone Encounter (Signed)
I spoke with pt; pt said starting on 03/26/23 on and off pt had lower abd pain on rt and lt side of abd that was sharp but only last few seconds on and off. Last time had was few mins ago. At time of pain the pain level is 8; right now no pain at all. Pt said no UTI symptoms and no constipation. Pt said did have loose stool earlier this morning. Pt has had nausea with pain but no vomiting. No fever. Pt said she had not felt like she was going to pass out; pt said she has experienced light headedness when she has the abd pain. Pt said she has had no H/A, CP or SOB. Pt did say when has lower abd pain it takes her a min to catch her breath due to pain in abd. Pt has appendix. Pt said was doing yard work on 03/26/23 but does not think that is causing this pain. Pt does not think she needs to go to UC or ED at this time. Pt scheduled first available appt at [facility] 03/27/23 at 11:15 with Dr Alphonsus Sias and UC & ED precautions were given and pt voiced understanding. Sending note to Dr Alphonsus Sias and Alphonsus Sias pool.

## 2023-03-27 NOTE — Assessment & Plan Note (Addendum)
Really seems muscular Urinalysis normal Discussed brief course of aleve--1-2 daily Heating pad No indication for further testing  Unsure how it relates to the overall fatigue, etc. No clear illness Will monitor for now--update if continued/worsened symptoms

## 2023-05-18 DIAGNOSIS — H1132 Conjunctival hemorrhage, left eye: Secondary | ICD-10-CM | POA: Diagnosis not present

## 2023-05-23 DIAGNOSIS — Z13220 Encounter for screening for lipoid disorders: Secondary | ICD-10-CM | POA: Diagnosis not present

## 2023-05-23 DIAGNOSIS — Z01419 Encounter for gynecological examination (general) (routine) without abnormal findings: Secondary | ICD-10-CM | POA: Diagnosis not present

## 2023-05-23 DIAGNOSIS — Z13228 Encounter for screening for other metabolic disorders: Secondary | ICD-10-CM | POA: Diagnosis not present

## 2023-05-23 DIAGNOSIS — Z131 Encounter for screening for diabetes mellitus: Secondary | ICD-10-CM | POA: Diagnosis not present

## 2023-05-23 DIAGNOSIS — Z1151 Encounter for screening for human papillomavirus (HPV): Secondary | ICD-10-CM | POA: Diagnosis not present

## 2023-05-23 DIAGNOSIS — Z1329 Encounter for screening for other suspected endocrine disorder: Secondary | ICD-10-CM | POA: Diagnosis not present

## 2023-05-23 DIAGNOSIS — Z6833 Body mass index (BMI) 33.0-33.9, adult: Secondary | ICD-10-CM | POA: Diagnosis not present

## 2023-05-23 DIAGNOSIS — Z1272 Encounter for screening for malignant neoplasm of vagina: Secondary | ICD-10-CM | POA: Diagnosis not present

## 2023-05-23 DIAGNOSIS — Z1231 Encounter for screening mammogram for malignant neoplasm of breast: Secondary | ICD-10-CM | POA: Diagnosis not present

## 2023-08-27 DIAGNOSIS — R634 Abnormal weight loss: Secondary | ICD-10-CM | POA: Diagnosis not present

## 2023-08-27 DIAGNOSIS — Z6831 Body mass index (BMI) 31.0-31.9, adult: Secondary | ICD-10-CM | POA: Diagnosis not present
